# Patient Record
Sex: Male | Born: 1985 | Race: Black or African American | Hispanic: No | Marital: Single | Smoking: Current every day smoker
Health system: Southern US, Community
[De-identification: ages and names within clinical notes are randomized; demographics above are authoritative.]

## PROBLEM LIST (undated history)

## (undated) DIAGNOSIS — J45909 Unspecified asthma, uncomplicated: Secondary | ICD-10-CM

## (undated) DIAGNOSIS — I10 Essential (primary) hypertension: Secondary | ICD-10-CM

## (undated) DIAGNOSIS — F419 Anxiety disorder, unspecified: Secondary | ICD-10-CM

## (undated) DIAGNOSIS — T7840XA Allergy, unspecified, initial encounter: Secondary | ICD-10-CM

## (undated) HISTORY — DX: Unspecified asthma, uncomplicated: J45.909

## (undated) HISTORY — DX: Essential (primary) hypertension: I10

## (undated) HISTORY — DX: Allergy, unspecified, initial encounter: T78.40XA

---
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
## 2009-06-22 ENCOUNTER — Emergency Department (HOSPITAL_COMMUNITY): Admission: EM | Admit: 2009-06-22 | Discharge: 2009-06-22 | Payer: Self-pay | Admitting: Emergency Medicine

## 2010-05-07 ENCOUNTER — Emergency Department (HOSPITAL_COMMUNITY)
Admission: EM | Admit: 2010-05-07 | Discharge: 2010-05-07 | Disposition: A | Discharge status: Home / Self Care | Payer: Managed Care, Other (non HMO) | Attending: Emergency Medicine | Admitting: Emergency Medicine

## 2010-05-07 DIAGNOSIS — S01501A Unspecified open wound of lip, initial encounter: Secondary | ICD-10-CM | POA: Insufficient documentation

## 2010-05-07 DIAGNOSIS — Y9229 Other specified public building as the place of occurrence of the external cause: Secondary | ICD-10-CM | POA: Insufficient documentation

## 2010-05-09 ENCOUNTER — Emergency Department (HOSPITAL_COMMUNITY)
Admission: EM | Admit: 2010-05-09 | Discharge: 2010-05-09 | Disposition: A | Discharge status: Home / Self Care | Payer: Managed Care, Other (non HMO) | Attending: Emergency Medicine | Admitting: Emergency Medicine

## 2010-05-09 DIAGNOSIS — L089 Local infection of the skin and subcutaneous tissue, unspecified: Secondary | ICD-10-CM | POA: Insufficient documentation

## 2010-05-09 DIAGNOSIS — Z4802 Encounter for removal of sutures: Secondary | ICD-10-CM | POA: Insufficient documentation

## 2010-05-09 DIAGNOSIS — X58XXXA Exposure to other specified factors, initial encounter: Secondary | ICD-10-CM | POA: Insufficient documentation

## 2010-05-12 ENCOUNTER — Emergency Department (HOSPITAL_COMMUNITY): Payer: Managed Care, Other (non HMO)

## 2010-05-12 ENCOUNTER — Emergency Department (HOSPITAL_COMMUNITY)
Admission: EM | Admit: 2010-05-12 | Discharge: 2010-05-12 | Disposition: A | Discharge status: Home / Self Care | Payer: Managed Care, Other (non HMO) | Attending: Emergency Medicine | Admitting: Emergency Medicine

## 2010-05-12 DIAGNOSIS — Z139 Encounter for screening, unspecified: Secondary | ICD-10-CM | POA: Insufficient documentation

## 2010-05-12 DIAGNOSIS — R6884 Jaw pain: Secondary | ICD-10-CM | POA: Insufficient documentation

## 2010-05-12 DIAGNOSIS — R51 Headache: Secondary | ICD-10-CM | POA: Insufficient documentation

## 2011-03-10 ENCOUNTER — Ambulatory Visit (INDEPENDENT_AMBULATORY_CARE_PROVIDER_SITE_OTHER): Payer: Managed Care, Other (non HMO) | Admitting: Family Medicine

## 2011-03-10 VITALS — BP 117/74 | HR 84 | Temp 98.0°F | Resp 18 | Ht 68.0 in | Wt 224.0 lb

## 2011-03-10 DIAGNOSIS — F17200 Nicotine dependence, unspecified, uncomplicated: Secondary | ICD-10-CM

## 2011-03-10 DIAGNOSIS — J069 Acute upper respiratory infection, unspecified: Secondary | ICD-10-CM

## 2011-03-10 DIAGNOSIS — J029 Acute pharyngitis, unspecified: Secondary | ICD-10-CM

## 2011-03-10 DIAGNOSIS — Z716 Tobacco abuse counseling: Secondary | ICD-10-CM

## 2011-03-10 MED ORDER — FLUTICASONE PROPIONATE 50 MCG/ACT NA SUSP: 2.0000 | Freq: Every day | NASAL | Status: DC

## 2011-03-10 NOTE — Progress Notes (Signed)
  Subjective:    Patient ID: Henry Douglas, male    DOB: 03/29/1985, 26 y.o.   MRN: [id number]  HPI Patient started getting sick yesterday with a sore throat. He thinks he did have a fever. Then he developed a lot of head congestion he has only the sneezed twice. He has continued with the head congestion and sore throat today there is not febrile today. He went to work and left at noon. He denies much in the way of cough. He does smoke about a pack of cigarettes a day.  Otherwise is generally healthy and has not had repeated such infections. He lives with his mother, she has not been ill. He does not know if an obvious exposure to anything.   Review of Systems No ear pain. No chest pain   no headache but he does have knee and elbow pains. Objective:   Physical Exam  No acute distress at this time. Throat mildly erythematous but the uvula is a little ganglion swollen. His TMs are normal. Neck supple without nodes. Chest has some soft scattered wheezes at the bases. Heart was regular without murmurs.    Assessment & Plan:  Pharyngitis, URI This could either be a virus or strep. Suspect the former.  Strep screen is pending. Results for orders placed in visit on 03/10/11  POCT RAPID STREP A (OFFICE)      Component Value Range   Rapid Strep A Screen Negative  Negative      Long discussion about stopping smoking.  Seasonal instructions.

## 2011-03-10 NOTE — Patient Instructions (Addendum)
Claritin-D 1 Daily       Upper Respiratory Infection, Adult An upper respiratory infection (URI) is also known as the common cold. It is often caused by a type of germ (virus). Colds are easily spread (contagious). You can pass it to others by kissing, coughing, sneezing, or drinking out of the same glass. Usually, you get better in 1 or 2 weeks.  HOME CARE   Only take medicine as told by your doctor.   Use a warm mist humidifier or breathe in steam from a hot shower.   Drink enough water and fluids to keep your pee (urine) clear or pale yellow.   Get plenty of rest.   Return to work when your temperature is back to normal or as told by your doctor. You may use a face mask and wash your hands to stop your cold from spreading.  GET HELP RIGHT AWAY IF:   After the first few days, you feel you are getting worse.   You have questions about your medicine.   You have chills, shortness of breath, or brown or red spit (mucus).   You have yellow or brown snot (nasal discharge) or pain in the face, especially when you bend forward.   You have a fever, puffy (swollen) neck, pain when you swallow, or white spots in the back of your throat.   You have a bad headache, ear pain, sinus pain, or chest pain.   You have a high-pitched whistling sound when you breathe in and out (wheezing).   You have a lasting cough or cough up blood.   You have sore muscles or a stiff neck.  MAKE SURE YOU:   Understand these instructions.   Will watch your condition.   Will get help right away if you are not doing well or get worse.  Document Released: 06/28/2007 Document Revised: 09/21/2010 Document Reviewed: 05/16/2010 [facility] Patient Information 2012 [place], [place].

## 2012-01-09 ENCOUNTER — Ambulatory Visit (INDEPENDENT_AMBULATORY_CARE_PROVIDER_SITE_OTHER): Payer: Managed Care, Other (non HMO) | Admitting: Physician Assistant

## 2012-01-09 VITALS — BP 110/77 | HR 99 | Temp 99.9°F | Resp 18 | Ht 67.0 in | Wt 221.0 lb

## 2012-01-09 DIAGNOSIS — Z20818 Contact with and (suspected) exposure to other bacterial communicable diseases: Secondary | ICD-10-CM

## 2012-01-09 DIAGNOSIS — B9789 Other viral agents as the cause of diseases classified elsewhere: Secondary | ICD-10-CM

## 2012-01-09 DIAGNOSIS — R509 Fever, unspecified: Secondary | ICD-10-CM

## 2012-01-09 DIAGNOSIS — R51 Headache: Secondary | ICD-10-CM

## 2012-01-09 DIAGNOSIS — R52 Pain, unspecified: Secondary | ICD-10-CM

## 2012-01-09 DIAGNOSIS — J029 Acute pharyngitis, unspecified: Secondary | ICD-10-CM

## 2012-01-09 LAB — POCT CBC
Granulocyte percent: 75.3 %G (ref 37–80)
MCH, POC: 27.3 pg (ref 27–31.2)
MID (cbc): 1.1 — AB (ref 0–0.9)
POC Granulocyte: 10.5 — AB (ref 2–6.9)
POC LYMPH PERCENT: 16.7 %L (ref 10–50)
RBC: 5.42 M/uL (ref 4.69–6.13)
RDW, POC: 14.1 %
WBC: 13.9 10*3/uL — AB (ref 4.6–10.2)

## 2012-01-09 LAB — POCT INFLUENZA A/B: Influenza B, POC: NEGATIVE

## 2012-01-09 MED ORDER — PENICILLIN V POTASSIUM 500 MG PO TABS: 500.0000 mg | ORAL_TABLET | Freq: Three times a day (TID) | ORAL | Status: DC

## 2012-01-09 MED ORDER — ACETAMINOPHEN 325 MG PO TABS
1000.0000 mg | ORAL_TABLET | Freq: Once | ORAL | Status: AC
  Administered 2012-01-09: 975 mg via ORAL

## 2012-01-09 NOTE — Progress Notes (Signed)
  Subjective:    Patient ID: Henry Douglas, male    DOB: December 05, 1985, 26 y.o.   MRN: [id number]  HPI 26 yr old AAM presents with a 1 day history of sore throat, body aches, chills.  His boss had strep throat 2 weeks ago. He had a flu shot in October of this year. He took tylenol last night and no other OTCs.  He feels weak.  He denies any health problems. No SOB, very little cough.  He has had nasal congestion and some runny nose.  Review of Systems  All other systems reviewed and are negative.       Objective:   Physical Exam  Nursing note and vitals reviewed. Constitutional: He is oriented to person, place, and time. He appears well-developed and well-nourished.       Ill, but non-acute appearing  HENT:  Head: Normocephalic and atraumatic.  Right Ear: External ear normal.  Left Ear: External ear normal.  Mouth/Throat: No oropharyngeal exudate (he has B erythema of tonsils).       Clear rhinorrhea in nose.  Neck: Normal range of motion. Neck supple.  Cardiovascular: Normal rate, regular rhythm and normal heart sounds.   Pulmonary/Chest: Effort normal and breath sounds normal.  Lymphadenopathy:    He has no cervical adenopathy.  Neurological: He is alert and oriented to person, place, and time.  Skin: Skin is warm and dry.  Psychiatric: He has a normal mood and affect. His behavior is normal.    Results for orders placed in visit on 01/09/12  POCT INFLUENZA A/B      Component Value Range   Influenza A, POC Negative     Influenza B, POC Negative    POCT RAPID STREP A (OFFICE)      Component Value Range   Rapid Strep A Screen Negative  Negative  POCT CBC      Component Value Range   WBC 13.9 (*) 4.6 - 10.2 K/uL   Lymph, poc 2.3  0.6 - 3.4   POC LYMPH PERCENT 16.7  10 - 50 %L   MID (cbc) 1.1 (*) 0 - 0.9   POC MID % 8.0  0 - 12 %M   POC Granulocyte 10.5 (*) 2 - 6.9   Granulocyte percent 75.3  37 - 80 %G   RBC 5.42  4.69 - 6.13 M/uL   Hemoglobin 14.8  14.1 - 18.1 g/dL    HCT, POC 40.9  81.1 - 53.7 %   MCV 88.9  80 - 97 fL   MCH, POC 27.3  27 - 31.2 pg   MCHC 30.7 (*) 31.8 - 35.4 g/dL   RDW, POC 91.4     Platelet Count, POC 341  142 - 424 K/uL   MPV 6.4  0 - 99.8 fL   \      Assessment & Plan:  Pharyngitis, body aches, fever-I will cover for strep because he has had a definite exposure and leukocytosis.  Sending culture.

## 2012-01-09 NOTE — Patient Instructions (Addendum)
Push fluids and rest. OOWX 2 days.

## 2012-11-11 ENCOUNTER — Ambulatory Visit: Payer: Managed Care, Other (non HMO)

## 2012-11-11 ENCOUNTER — Ambulatory Visit (INDEPENDENT_AMBULATORY_CARE_PROVIDER_SITE_OTHER): Payer: Managed Care, Other (non HMO) | Admitting: Family Medicine

## 2012-11-11 VITALS — BP 125/71 | HR 91 | Temp 98.3°F | Resp 18 | Wt 220.0 lb

## 2012-11-11 DIAGNOSIS — R079 Chest pain, unspecified: Secondary | ICD-10-CM

## 2012-11-11 MED ORDER — PREDNISONE 20 MG PO TABS: ORAL_TABLET | ORAL | Status: DC

## 2012-11-11 MED ORDER — CYCLOBENZAPRINE HCL 5 MG PO TABS: 5.0000 mg | ORAL_TABLET | Freq: Every day | ORAL | Status: DC

## 2012-11-11 NOTE — Patient Instructions (Signed)
Follow up this weekend.

## 2012-11-11 NOTE — Progress Notes (Signed)
  Subjective:  This chart was scribed for Henry Sidle, MD by [name], ED scribe.  This patient was seen in room [facility] Room 14 and the patient's care was started at 7:56 PM.   Patient ID: Henry Douglas, male    DOB: 28-May-1985, 27 y.o.   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  He presents with a chief complaint of 3 days of intermittent sharp central chest pain radiating to his back.  Pain initially began on waking.  It is intermittent and generally worsens throughout the day with physical activities.  It is worsened by lying down and sometimes by lifting and lowering his arms.  It is slightly worsened by deep breathing.  His pain woke him up this morning at 5 AM.  He also complains of intermittent SOB.  He also notes some difficulty sleeping due to his pain.  He denies nausea, difficulty swallowing, fever, night sweats, or abdominal pain.  He denies prior h/o similar symptoms.  He states he has h/o asthma as a child and he has been told he has hypertension "when I was younger" but he does not take any medications for this.      Past Medical History  Diagnosis Date  . Allergy   . Asthma   . Hypertension     Prior to Admission medications   Not on File    Allergies  Allergen Reactions  . Nystatin Swelling    Throat swelling      Review of Systems  Respiratory: Positive for shortness of breath.   Cardiovascular: Positive for chest pain.       Objective:   Physical Exam no acute distress HEENT: Unremarkable Chest: Clear to auscultation, patient very tender in the midclavicular line at about the third rib. Heart: Regular no murmur EKG: Normal sinus rhythm [facility] reading (PRIMARY) by  Dr. Milus Glazier: Normal chest x-ray.      Assessment & Plan:   Pectoralis strain with irritation of superficial pectoral nerve  Plan: Flexeril 5 mg at at bedtime, prednisone 40 mg daily x5, followup in  5 days

## 2012-11-16 ENCOUNTER — Ambulatory Visit (INDEPENDENT_AMBULATORY_CARE_PROVIDER_SITE_OTHER): Payer: Managed Care, Other (non HMO) | Admitting: Family Medicine

## 2012-11-16 VITALS — BP 120/80 | HR 91 | Temp 98.3°F | Resp 16 | Ht 67.5 in | Wt 220.0 lb

## 2012-11-16 DIAGNOSIS — M25519 Pain in unspecified shoulder: Secondary | ICD-10-CM

## 2012-11-16 DIAGNOSIS — M25512 Pain in left shoulder: Secondary | ICD-10-CM

## 2012-11-16 NOTE — Patient Instructions (Signed)

## 2012-11-20 NOTE — Progress Notes (Signed)
This chart was scribed for Norberto Sorenson, MD, by Yevette Edwards, ED Scribe. This patient's care was started at 4:07 PM.   Subjective:    Patient ID: Henry Douglas, male    DOB: 09-11-1985, 27 y.o.   MRN: [id number]  Chief Complaint  Patient presents with  . Follow-up    still having pain in (L) shoulder   HPI  HPI Comments: Henry Douglas is a 27 y.o. male who presents to the [facility] complaining of stiffness to his left shoulder which began two days ago. He denies any recent injuries to his shoulder. The pt states that he experiences intermittent stiffness to his left shoulder, especially when he raises his arm above his head. He denies any changes to his ROM, weakness, or paresthesia to his arm. The flexeril which he was prescribed five days ago has helped with the stiffness. He has not been using ice or heat to mitigate the stiffness.   He was treated at [facility] five days ago complaining of chest pain which had been occurring for three days and which was worsened with deep inspiration, movement, and recumbency. He was diagnosed with pectoralis strain by Dr. Milus Glazier and placed on prednisone 40 mg/ a day and flexeril for night-time use. The pt had a normal chest x-ray and norma EKG. The pt reports resolution to the chest pain.   He has been placed on leave form work due to the chest pain, and he cannot return to work until he has been cleared by a doctor. The pt is ready to return to work.   Past Medical History  Diagnosis Date  . Allergy   . Asthma   . Hypertension    Current Outpatient Prescriptions on File Prior to Visit  Medication Sig Dispense Refill  . cyclobenzaprine (FLEXERIL) 5 MG tablet Take 1 tablet (5 mg total) by mouth at bedtime.  30 tablet  1   No current facility-administered medications on file prior to visit.   Allergies  Allergen Reactions  . Nystatin Swelling    Throat swelling    Review of Systems  Constitutional: Negative for fever, chills, diaphoresis,  activity change, appetite change and fatigue.  Respiratory: Negative for chest tightness and shortness of breath.   Cardiovascular: Negative for chest pain.  Musculoskeletal: Positive for arthralgias and myalgias. Negative for gait problem, joint swelling, neck pain and neck stiffness.  Skin: Negative for color change, rash and wound.  Neurological: Negative for weakness and numbness.  Hematological: Negative for adenopathy. Does not bruise/bleed easily.    Vitals: BP 120/80  Pulse 91  Temp(Src) 98.3 F (36.8 C) (Oral)  Resp 16  Ht 5' 7.5" (1.715 m)  Wt 220 lb (99.791 kg)  BMI 33.93 kg/m2  SpO2 100%     Objective:   Physical Exam  Nursing note and vitals reviewed. Constitutional: He is oriented to person, place, and time. He appears well-developed and well-nourished. No distress.  HENT:  Head: Normocephalic and atraumatic.  Eyes: EOM are normal.  Neck: Neck supple. No tracheal deviation present.  Cardiovascular: Normal rate.   Pulmonary/Chest: Effort normal. No respiratory distress.  Musculoskeletal: Normal range of motion.  Good strength on empty can test.  Normal ROM.  Normal internal and external rotation strength.  Normal deltoid, biceps, and triceps strength.   Neurological: He is alert and oriented to person, place, and time.  Skin: Skin is warm and dry.  Psychiatric: He has a normal mood and affect. His behavior is normal.  Assessment & Plan:  4:13 PM- Discussed treatment plan with patient which includes a renewal of flexeril, and the patient agreed to the plan. Informed pt that it typically takes 4-6 weeks for resolution of muscle strain. Advised the pt to place heat to the shoulder in combination with using flexeril at night. Warned pt to continue to use his shoulder to prevent "frozen shoulder." Pain in joint, shoulder region, left   I personally performed the services described in this documentation, which was scribed in my presence. The recorded  information has been reviewed and considered, and addended by me as needed.  Norberto Sorenson, MD MPH

## 2013-02-03 ENCOUNTER — Ambulatory Visit (INDEPENDENT_AMBULATORY_CARE_PROVIDER_SITE_OTHER): Payer: Managed Care, Other (non HMO) | Admitting: Physician Assistant

## 2013-02-03 VITALS — BP 136/82 | HR 78 | Temp 98.2°F | Resp 16 | Ht 68.0 in | Wt 216.0 lb

## 2013-02-03 DIAGNOSIS — J988 Other specified respiratory disorders: Principal | ICD-10-CM

## 2013-02-03 DIAGNOSIS — R05 Cough: Secondary | ICD-10-CM

## 2013-02-03 DIAGNOSIS — R059 Cough, unspecified: Secondary | ICD-10-CM

## 2013-02-03 DIAGNOSIS — M791 Myalgia, unspecified site: Secondary | ICD-10-CM

## 2013-02-03 DIAGNOSIS — IMO0001 Reserved for inherently not codable concepts without codable children: Secondary | ICD-10-CM

## 2013-02-03 DIAGNOSIS — B9789 Other viral agents as the cause of diseases classified elsewhere: Secondary | ICD-10-CM

## 2013-02-03 DIAGNOSIS — J029 Acute pharyngitis, unspecified: Secondary | ICD-10-CM

## 2013-02-03 LAB — POCT RAPID STREP A (OFFICE): Rapid Strep A Screen: NEGATIVE

## 2013-02-03 LAB — POCT INFLUENZA A/B
INFLUENZA B, POC: NEGATIVE
Influenza A, POC: NEGATIVE

## 2013-02-03 MED ORDER — BENZONATATE 100 MG PO CAPS: 100.0000 mg | ORAL_CAPSULE | Freq: Three times a day (TID) | ORAL | Status: DC | PRN

## 2013-02-03 MED ORDER — IPRATROPIUM BROMIDE 0.03 % NA SOLN: 2.0000 | Freq: Two times a day (BID) | NASAL | Status: DC

## 2013-02-03 NOTE — Progress Notes (Signed)
   Subjective:    Patient ID: Henry Douglas, male    DOB: 1985-05-18, 28 y.o.   MRN: [id number]  HPI   Henry Douglas is a pleasant 28 yr old male here with concern for illness.   Symptoms include sore throat, muscle aches, chills, productive cough.  Endorses a little SOB but no wheezing.  Some associated HA and nasal congestion.  All symptoms started about 3 days ago.  Subjective fever on the first night but did not take temp.  No GI symptoms.  There was one sick contact at work, unsure of diagnosis.  No flu shot this season.  Currently smoking 1/2ppd.  Has used Nyquil for symptoms which provides some relief.     Review of Systems  Constitutional: Positive for fever (subjective) and chills.  HENT: Positive for congestion and sore throat. Negative for rhinorrhea.   Respiratory: Positive for cough. Negative for shortness of breath and wheezing.   Cardiovascular: Negative.   Gastrointestinal: Negative.   Musculoskeletal: Positive for arthralgias and myalgias.  Skin: Negative.   Neurological: Positive for headaches.       Objective:   Physical Exam  Vitals reviewed. Constitutional: He is oriented to person, place, and time. He appears well-developed and well-nourished. No distress.  HENT:  Head: Normocephalic and atraumatic.  Right Ear: Tympanic membrane and ear canal normal.  Left Ear: Tympanic membrane and ear canal normal.  Mouth/Throat: Uvula is midline and mucous membranes are normal. Posterior oropharyngeal erythema present. No oropharyngeal exudate or posterior oropharyngeal edema.  Eyes: Conjunctivae are normal. No scleral icterus.  Neck: Neck supple.  Cardiovascular: Normal rate, regular rhythm and normal heart sounds.   Pulmonary/Chest: Effort normal and breath sounds normal. He has no wheezes. He has no rales.  Lymphadenopathy:    He has no cervical adenopathy.  Neurological: He is alert and oriented to person, place, and time.  Skin: Skin is warm and dry.  Psychiatric:  He has a normal mood and affect. His behavior is normal.     Results for orders placed in visit on 02/03/13  POCT INFLUENZA A/B      Result Value Range   Influenza A, POC Negative     Influenza B, POC Negative    POCT RAPID STREP A (OFFICE)      Result Value Range   Rapid Strep A Screen Negative  Negative        Assessment & Plan:  Viral respiratory illness - Plan: ipratropium (ATROVENT) 0.03 % nasal spray, DISCONTINUED: ipratropium (ATROVENT) 0.03 % nasal spray  Sore throat - Plan: POCT Influenza A/B, POCT rapid strep A, Culture, Group A Strep  Myalgia - Plan: POCT Influenza A/B, POCT rapid strep A, Culture, Group A Strep  Cough - Plan: POCT Influenza A/B, benzonatate (TESSALON) 100 MG capsule, DISCONTINUED: benzonatate (TESSALON) 100 MG capsule   Henry Douglas is a pleasant 28 yr old male here with URI symptoms, myalgia.  No documented fevers.  Rapid flu and strep tests both negative.  Throat culture pending.  Suspect viral illness, possibly influenza.  At this point symptom onset >48 hrs ago, no benefit from Tamiflu. Will treat symptoms with Atrovent, Tessalon.  Push fluids rest.  Work note provided.  Discussed RTC precautions with patient  E. Frances FurbishElizabeth Irvin Lizama MHS, PA-C [facility] 1/12/201512:02 PM

## 2013-02-03 NOTE — Patient Instructions (Signed)
Begin using the ipratropium nasal spray 2-3 times per day to help relieve congestion  Use the benzonatate cough pills every 8 hours as needed for cough  Mucinex, Cepacol if needed  Drink plenty of fluids (water is best!) and get plenty of rest  Please let us know if any symptoms are worsening or not improving   Upper Respiratory Infection, Adult An upper respiratory infection (URI) is also sometimes known as the common cold. The upper respiratory tract includes the nose, sinuses, throat, trachea, and bronchi. Bronchi are the airways leading to the lungs. Most people improve within 1 week, but symptoms can last up to 2 weeks. A residual cough may last even longer.  CAUSES Many different viruses can infect the tissues lining the upper respiratory tract. The tissues become irritated and inflamed and often become very moist. Mucus production is also common. A cold is contagious. You can easily spread the virus to others by oral contact. This includes kissing, sharing a glass, coughing, or sneezing. Touching your mouth or nose and then touching a surface, which is then touched by another person, can also spread the virus. SYMPTOMS  Symptoms typically develop 1 to 3 days after you come in contact with a cold virus. Symptoms vary from person to person. They may include:  Runny nose.  Sneezing.  Nasal congestion.  Sinus irritation.  Sore throat.  Loss of voice (laryngitis).  Cough.  Fatigue.  Muscle aches.  Loss of appetite.  Headache.  Low-grade fever. DIAGNOSIS  You might diagnose your own cold based on familiar symptoms, since most people get a cold 2 to 3 times a year. Your caregiver can confirm this based on your exam. Most importantly, your caregiver can check that your symptoms are not due to another disease such as strep throat, sinusitis, pneumonia, asthma, or epiglottitis. Blood tests, throat tests, and X-rays are not necessary to diagnose a common cold, but they may  sometimes be helpful in excluding other more serious diseases. Your caregiver will decide if any further tests are required. RISKS AND COMPLICATIONS  You may be at risk for a more severe case of the common cold if you smoke cigarettes, have chronic heart disease (such as heart failure) or lung disease (such as asthma), or if you have a weakened immune system. The very young and very old are also at risk for more serious infections. Bacterial sinusitis, middle ear infections, and bacterial pneumonia can complicate the common cold. The common cold can worsen asthma and chronic obstructive pulmonary disease (COPD). Sometimes, these complications can require emergency medical care and may be life-threatening. PREVENTION  The best way to protect against getting a cold is to practice good hygiene. Avoid oral or hand contact with people with cold symptoms. Wash your hands often if contact occurs. There is no clear evidence that vitamin C, vitamin E, echinacea, or exercise reduces the chance of developing a cold. However, it is always recommended to get plenty of rest and practice good nutrition. TREATMENT  Treatment is directed at relieving symptoms. There is no cure. Antibiotics are not effective, because the infection is caused by a virus, not by bacteria. Treatment may include:  Increased fluid intake. Sports drinks offer valuable electrolytes, sugars, and fluids.  Breathing heated mist or steam (vaporizer or shower).  Eating chicken soup or other clear broths, and maintaining good nutrition.  Getting plenty of rest.  Using gargles or lozenges for comfort.  Controlling fevers with ibuprofen or acetaminophen as directed by your caregiver.  Increasing usage of your inhaler if you have asthma. Zinc gel and zinc lozenges, taken in the first 24 hours of the common cold, can shorten the duration and lessen the severity of symptoms. Pain medicines may help with fever, muscle aches, and throat pain. A  variety of non-prescription medicines are available to treat congestion and runny nose. Your caregiver can make recommendations and may suggest nasal or lung inhalers for other symptoms.  HOME CARE INSTRUCTIONS   Only take over-the-counter or prescription medicines for pain, discomfort, or fever as directed by your caregiver.  Use a warm mist humidifier or inhale steam from a shower to increase air moisture. This may keep secretions moist and make it easier to breathe.  Drink enough water and fluids to keep your urine clear or pale yellow.  Rest as needed.  Return to work when your temperature has returned to normal or as your caregiver advises. You may need to stay home longer to avoid infecting others. You can also use a face mask and careful hand washing to prevent spread of the virus. SEEK MEDICAL CARE IF:   After the first few days, you feel you are getting worse rather than better.  You need your caregiver's advice about medicines to control symptoms.  You develop chills, worsening shortness of breath, or brown or red sputum. These may be signs of pneumonia.  You develop yellow or brown nasal discharge or pain in the face, especially when you bend forward. These may be signs of sinusitis.  You develop a fever, swollen neck glands, pain with swallowing, or white areas in the back of your throat. These may be signs of strep throat. SEEK IMMEDIATE MEDICAL CARE IF:   You have a fever.  You develop severe or persistent headache, ear pain, sinus pain, or chest pain.  You develop wheezing, a prolonged cough, cough up blood, or have a change in your usual mucus (if you have chronic lung disease).  You develop sore muscles or a stiff neck. Document Released: 07/05/2000 Document Revised: 04/03/2011 Document Reviewed: 05/13/2010 [facility] Patient Information 2014 [place].

## 2013-02-07 LAB — CULTURE, GROUP A STREP

## 2013-02-25 ENCOUNTER — Ambulatory Visit (INDEPENDENT_AMBULATORY_CARE_PROVIDER_SITE_OTHER): Payer: Managed Care, Other (non HMO) | Admitting: Family Medicine

## 2013-02-25 VITALS — BP 128/60 | HR 118 | Temp 102.9°F | Resp 16 | Ht 67.0 in | Wt 216.0 lb

## 2013-02-25 DIAGNOSIS — R509 Fever, unspecified: Secondary | ICD-10-CM

## 2013-02-25 DIAGNOSIS — B9789 Other viral agents as the cause of diseases classified elsewhere: Secondary | ICD-10-CM

## 2013-02-25 DIAGNOSIS — J029 Acute pharyngitis, unspecified: Secondary | ICD-10-CM

## 2013-02-25 DIAGNOSIS — B349 Viral infection, unspecified: Secondary | ICD-10-CM

## 2013-02-25 LAB — POCT RAPID STREP A (OFFICE): Rapid Strep A Screen: NEGATIVE

## 2013-02-25 LAB — POCT INFLUENZA A/B
INFLUENZA B, POC: NEGATIVE
Influenza A, POC: NEGATIVE

## 2013-02-25 MED ORDER — OSELTAMIVIR PHOSPHATE 75 MG PO CAPS: 75.0000 mg | ORAL_CAPSULE | Freq: Two times a day (BID) | ORAL | Status: DC

## 2013-02-25 NOTE — Progress Notes (Signed)
Subjective: 28 year old man who was okay until this morning. He developed a runny nose sore throat. He developed fevers the day went on. He took some Advil sinus and Mucinex. He is roommate brought him here to the office. He has had strep in the past. He did not get a flu shot this year. Having a runny nose not much cough. He had very much the same symptoms 3 weeks ago and test negative.  Objective:  Sick appearing, shaking with cold. Skin very warm to touch. TMs normal. Throat erythematous without exudate the uvula looks well swollen. Neck was supple tender anteriorly with some small nodes. Chest is clear to auscultation. Heart regular without murmurs.  Assessment: Acute respiratory illness, influenza versus strep versus other Flu-like symptoma  Plan: Flu swab Strep test  Results for orders placed in visit on 02/25/13  POCT RAPID STREP A (OFFICE)      Result Value Range   Rapid Strep A Screen Negative  Negative  POCT INFLUENZA A/B      Result Value Range   Influenza A, POC Negative     Influenza B, POC Negative     Will treat for him even though the flu swab is negative.

## 2013-02-25 NOTE — Patient Instructions (Addendum)
Drink plenty of fluids  Take Tylenol (500 mg x2) every 6 hours as needed for fever and aching. Can alternate this with ibuprofen (3x200 mg) every 6 hours as needed  Get plenty of rest  Return if worse

## 2013-02-28 LAB — CULTURE, GROUP A STREP: Organism ID, Bacteria: NORMAL

## 2013-03-04 ENCOUNTER — Ambulatory Visit (INDEPENDENT_AMBULATORY_CARE_PROVIDER_SITE_OTHER): Payer: Managed Care, Other (non HMO) | Admitting: Family Medicine

## 2013-03-04 VITALS — BP 120/66 | HR 119 | Temp 100.6°F | Resp 18 | Ht 68.0 in | Wt 213.8 lb

## 2013-03-04 DIAGNOSIS — K5289 Other specified noninfective gastroenteritis and colitis: Secondary | ICD-10-CM

## 2013-03-04 DIAGNOSIS — R197 Diarrhea, unspecified: Secondary | ICD-10-CM

## 2013-03-04 DIAGNOSIS — K529 Noninfective gastroenteritis and colitis, unspecified: Secondary | ICD-10-CM

## 2013-03-04 DIAGNOSIS — R112 Nausea with vomiting, unspecified: Secondary | ICD-10-CM

## 2013-03-04 DIAGNOSIS — Z113 Encounter for screening for infections with a predominantly sexual mode of transmission: Secondary | ICD-10-CM

## 2013-03-04 DIAGNOSIS — R509 Fever, unspecified: Secondary | ICD-10-CM

## 2013-03-04 LAB — POCT CBC
Granulocyte percent: 83.4 %G — AB (ref 37–80)
HEMATOCRIT: 46.4 % (ref 43.5–53.7)
Hemoglobin: 14.6 g/dL (ref 14.1–18.1)
Lymph, poc: 0.6 (ref 0.6–3.4)
MCH: 28 pg (ref 27–31.2)
MCHC: 31.5 g/dL — AB (ref 31.8–35.4)
MCV: 89 fL (ref 80–97)
MID (CBC): 0.4 (ref 0–0.9)
MPV: 6.5 fL (ref 0–99.8)
POC Granulocyte: 5.3 (ref 2–6.9)
POC LYMPH PERCENT: 9.8 %L — AB (ref 10–50)
POC MID %: 6.8 %M (ref 0–12)
Platelet Count, POC: 312 10*3/uL (ref 142–424)
RBC: 5.21 M/uL (ref 4.69–6.13)
RDW, POC: 14 %
WBC: 6.3 10*3/uL (ref 4.6–10.2)

## 2013-03-04 LAB — POCT URINALYSIS DIPSTICK
Bilirubin, UA: NEGATIVE
Glucose, UA: NEGATIVE
KETONES UA: NEGATIVE
Leukocytes, UA: NEGATIVE
Nitrite, UA: NEGATIVE
PH UA: 5.5
RBC UA: NEGATIVE
SPEC GRAV UA: 1.025
UROBILINOGEN UA: 0.2

## 2013-03-04 MED ORDER — ONDANSETRON 4 MG PO TBDP
4.0000 mg | ORAL_TABLET | Freq: Once | ORAL | Status: AC
  Administered 2013-03-04: 4 mg via ORAL

## 2013-03-04 NOTE — Patient Instructions (Signed)
Drink plenty of fluids  Take Tylenol if needed for pain  Take the Zofran every 4-6 hours as needed for nausea or vomiting  Get over the counter Imodium to use if needed for diarrhea. Full instructions on the box.  In the event of acute abdominal pain, prolonged vomiting, inability to keep down liquids, passing blood in stool or vomit, or other unpredicted problems go to the emergency room if necessary. Otherwise return here tomorrow if needed.

## 2013-03-04 NOTE — Progress Notes (Signed)
Subjective: 28 year old man who has had nausea vomiting and diarrhea since he got up at about 6 AM today. He was unable to work. He has had a fever and feels miserable. He has not been around anyone else with similar illness that he knows of. 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  He also requests STD testing.  Objective: Ill-appearing man. Chest clear. Heart tachycardic. Abdomen has occasional bowel sounds but is somewhat diminished. Generally soft without organomegaly masses or tenderness. No CVA tenderness.  Assessment: Nausea and vomiting Diarrhea Fever Acute femoral gastroenteritis  Plan: CBC and urine Zofran 4 mg by mouth Tylenol 1000 mg by mouth  Results for orders placed in visit on 03/04/13  POCT CBC      Result Value Range   WBC 6.3  4.6 - 10.2 K/uL   Lymph, poc 0.6  0.6 - 3.4   POC LYMPH PERCENT 9.8 (*) 10 - 50 %L   MID (cbc) 0.4  0 - 0.9   POC MID % 6.8  0 - 12 %M   POC Granulocyte 5.3  2 - 6.9   Granulocyte percent 83.4 (*) 37 - 80 %G   RBC 5.21  4.69 - 6.13 M/uL   Hemoglobin 14.6  14.1 - 18.1 g/dL   HCT, POC 16.146.4  09.643.5 - 53.7 %   MCV 89.0  80 - 97 fL   MCH, POC 28.0  27 - 31.2 pg   MCHC 31.5 (*) 31.8 - 35.4 g/dL   RDW, POC 04.514.0     Platelet Count, POC 312  142 - 424 K/uL   MPV 6.5  0 - 99.8 fL  POCT URINALYSIS DIPSTICK      Result Value Range   Color, UA yellow     Clarity, UA clear     Glucose, UA neg     Bilirubin, UA neg     Ketones, UA neg     Spec Grav, UA 1.025     Blood, UA neg     pH, UA 5.5     Protein, UA trace     Urobilinogen, UA 0.2     Nitrite, UA neg     Leukocytes, UA Negative     Will treat symptomatically for viral gastroenteritis

## 2013-03-05 ENCOUNTER — Telehealth: Payer: Self-pay

## 2013-03-05 LAB — HIV ANTIBODY (ROUTINE TESTING W REFLEX): HIV: NONREACTIVE

## 2013-03-05 LAB — RPR

## 2013-03-05 MED ORDER — ONDANSETRON 4 MG PO TBDP: ORAL_TABLET | ORAL | Status: DC

## 2013-03-05 NOTE — Telephone Encounter (Signed)
Pt called and stated that some zofran was supposed to have been sent to pharm and it is not at pharm. Checked w/Dr Alwyn RenHopper who gave OK to order for pt 4 mg ODT 1 tab Q 4-6 hrs prn nausea. #10. Sent in.

## 2013-03-06 LAB — GC/CHLAMYDIA PROBE AMP
CT Probe RNA: NEGATIVE
GC PROBE AMP APTIMA: NEGATIVE

## 2013-09-12 ENCOUNTER — Ambulatory Visit (INDEPENDENT_AMBULATORY_CARE_PROVIDER_SITE_OTHER): Payer: Managed Care, Other (non HMO) | Admitting: Internal Medicine

## 2013-09-12 VITALS — BP 126/80 | HR 111 | Temp 102.3°F | Resp 18 | Ht 69.0 in | Wt 208.0 lb

## 2013-09-12 DIAGNOSIS — J039 Acute tonsillitis, unspecified: Secondary | ICD-10-CM

## 2013-09-12 LAB — POCT CBC
GRANULOCYTE PERCENT: 86.1 % — AB (ref 37–80)
HCT, POC: 42.6 % — AB (ref 43.5–53.7)
Hemoglobin: 13.9 g/dL — AB (ref 14.1–18.1)
LYMPH, POC: 1.9 (ref 0.6–3.4)
MCH: 28.2 pg (ref 27–31.2)
MCHC: 32.7 g/dL (ref 31.8–35.4)
MCV: 86.2 fL (ref 80–97)
MID (CBC): 0.6 (ref 0–0.9)
MPV: 5.7 fL (ref 0–99.8)
POC Granulocyte: 15.5 — AB (ref 2–6.9)
POC LYMPH PERCENT: 10.4 %L (ref 10–50)
POC MID %: 3.5 % (ref 0–12)
Platelet Count, POC: 265 10*3/uL (ref 142–424)
RBC: 4.94 M/uL (ref 4.69–6.13)
RDW, POC: 13.6 %
WBC: 18 10*3/uL — AB (ref 4.6–10.2)

## 2013-09-12 LAB — POCT RAPID STREP A (OFFICE): RAPID STREP A SCREEN: NEGATIVE

## 2013-09-12 MED ORDER — AMOXICILLIN 875 MG PO TABS: 875.0000 mg | ORAL_TABLET | Freq: Two times a day (BID) | ORAL | Status: DC

## 2013-09-12 NOTE — Progress Notes (Signed)
   Subjective:  This chart was scribed for Tonye Pearsonobert P Adolph Clutter, MD by Bronson CurbJacqueline Melvin, ED Scribe. This patient was seen in room Room/bed 8 and the patient's care was started at 5:44 PM.   Patient ID: Henry Douglas, male    DOB: 14-Jul-1985, 28 y.o.   MRN: [id number]  HPI   HPI Comments: Henry Douglas is a 28 y.o. male who presents to the [facility] complaining of sore throat onset yesterday. There is associated fever (T max 102.3 F) and generalized body aches. Patient also reports pain when swallowing and states this prevents him from eating. He denies sick contacts.   Review of Systems  Constitutional: Positive for fever.  HENT: Positive for sore throat. Negative for mouth sores and sneezing.   Eyes: Negative for visual disturbance.  Respiratory: Negative for cough.   Gastrointestinal: Negative for vomiting and diarrhea.  Genitourinary: Negative for dysuria.  Musculoskeletal: Positive for myalgias.       Objective:   Physical Exam  Nursing note and vitals reviewed. Constitutional: He appears well-developed and well-nourished. He appears distressed.  HENT:  Right Ear: External ear normal.  Left Ear: External ear normal.  Nose: Nose normal.  Tonsils are 3+ and inflamed with exudate.  Eyes: Conjunctivae are normal. Pupils are equal, round, and reactive to light.  Cardiovascular: Regular rhythm.   No murmur heard. tachycardia  Pulmonary/Chest: Effort normal and breath sounds normal.  Lymphadenopathy:    He has cervical adenopathy.   Results for orders placed in visit on 09/12/13  POCT RAPID STREP A (OFFICE)      Result Value Ref Range   Rapid Strep A Screen Negative  Negative  POCT CBC      Result Value Ref Range   WBC 18.0 (*) 4.6 - 10.2 K/uL   Lymph, poc 1.9  0.6 - 3.4   POC LYMPH PERCENT 10.4  10 - 50 %L   MID (cbc) 0.6  0 - 0.9   POC MID % 3.5  0 - 12 %M   POC Granulocyte 15.5 (*) 2 - 6.9   Granulocyte percent 86.1 (*) 37 - 80 %G   RBC 4.94  4.69 - 6.13 M/uL   Hemoglobin 13.9 (*) 14.1 - 18.1 g/dL   HCT, POC 40.942.6 (*) 81.143.5 - 53.7 %   MCV 86.2  80 - 97 fL   MCH, POC 28.2  27 - 31.2 pg   MCHC 32.7  31.8 - 35.4 g/dL   RDW, POC 91.413.6     Platelet Count, POC 265  142 - 424 K/uL   MPV 5.7  0 - 99.8 fL   BP 126/80  Pulse 111  Temp(Src) 102.3 F (39.1 C) (Oral)  Resp 18  Ht 5\' 9"  (1.753 m)  Wt 208 lb (94.348 kg)  BMI 30.70 kg/m2  SpO2 98%     Assessment & Plan:  Acute tonsillitis - Plan: POCT rapid strep A, Culture, Group A Strep, POCT CBC  Meds ordered this encounter  Medications  . amoxicillin (AMOXIL) 875 MG tablet    Sig: Take 1 tablet (875 mg total) by mouth 2 (two) times daily.    Dispense:  20 tablet    Refill:  0   Ibuprofen thr cult sent  I have completed the patient encounter in its entirety as documented by the scribe, with editing by me where necessary. Delsie Amador P. Merla Richesoolittle, M.D.

## 2013-09-15 LAB — CULTURE, GROUP A STREP

## 2013-12-25 ENCOUNTER — Ambulatory Visit (INDEPENDENT_AMBULATORY_CARE_PROVIDER_SITE_OTHER): Payer: Managed Care, Other (non HMO) | Admitting: Emergency Medicine

## 2013-12-25 VITALS — BP 132/88 | HR 72 | Temp 98.1°F | Resp 16 | Ht 69.0 in | Wt 215.8 lb

## 2013-12-25 DIAGNOSIS — Z0189 Encounter for other specified special examinations: Secondary | ICD-10-CM

## 2013-12-25 DIAGNOSIS — Z202 Contact with and (suspected) exposure to infections with a predominantly sexual mode of transmission: Secondary | ICD-10-CM

## 2013-12-25 DIAGNOSIS — Z0000 Encounter for general adult medical examination without abnormal findings: Secondary | ICD-10-CM

## 2013-12-25 LAB — POCT CBC
GRANULOCYTE PERCENT: 56.1 % (ref 37–80)
HCT, POC: 44.8 % (ref 43.5–53.7)
Hemoglobin: 14.2 g/dL (ref 14.1–18.1)
LYMPH, POC: 2.4 (ref 0.6–3.4)
MCH, POC: 27.5 pg (ref 27–31.2)
MCHC: 31.8 g/dL (ref 31.8–35.4)
MCV: 86.5 fL (ref 80–97)
MID (CBC): 0.4 (ref 0–0.9)
MPV: 5.8 fL (ref 0–99.8)
POC Granulocyte: 3.5 (ref 2–6.9)
POC LYMPH %: 37.7 % (ref 10–50)
POC MID %: 6.2 % (ref 0–12)
Platelet Count, POC: 318 10*3/uL (ref 142–424)
RBC: 5.18 M/uL (ref 4.69–6.13)
RDW, POC: 14.1 %
WBC: 6.3 10*3/uL (ref 4.6–10.2)

## 2013-12-25 LAB — POCT URINALYSIS DIPSTICK
BILIRUBIN UA: NEGATIVE
Blood, UA: NEGATIVE
GLUCOSE UA: NEGATIVE
Ketones, UA: NEGATIVE
LEUKOCYTES UA: NEGATIVE
NITRITE UA: NEGATIVE
PH UA: 6
PROTEIN UA: NEGATIVE
SPEC GRAV UA: 1.02
UROBILINOGEN UA: 0.2

## 2013-12-25 NOTE — Progress Notes (Signed)
[facility] [address], [place] [id number] [phone]  Date:  12/25/2013   Name:  Henry Douglas   DOB:  [birth year]   MRN:  [id number]  PCP:  No PCP Per Patient    Chief Complaint: Annual Exam   History of Present Illness:  Henry Douglas is a 28 y.o. very pleasant male patient who presents with the following:  Requests a wellness exam Smokes 1 PPD. Works full-time Denies other complaint or health concern today.   There are no active problems to display for this patient.   Past Medical History  Diagnosis Date  . Allergy   . Asthma   . Hypertension     History reviewed. No pertinent past surgical history.  History  Substance Use Topics  . Smoking status: Current Every Day Smoker -- 1.00 packs/day for 3 years    Types: Cigarettes  . Smokeless tobacco: Not on file  . Alcohol Use: 0.0 oz/week    0 Not specified per week    Family History  Problem Relation Age of Onset  . Hypertension Mother   . Diabetes Sister     Allergies  Allergen Reactions  . Nystatin Swelling    Throat swelling    Medication list has been reviewed and updated.  Current Outpatient Prescriptions on File Prior to Visit  Medication Sig Dispense Refill  . cyclobenzaprine (FLEXERIL) 5 MG tablet Take 1 tablet (5 mg total) by mouth at bedtime. (Patient not taking: Reported on 12/25/2013) 30 tablet 1  . ipratropium (ATROVENT) 0.03 % nasal spray Place 2 sprays into the nose 2 (two) times daily. (Patient not taking: Reported on 12/25/2013) 30 mL 1  . ondansetron (ZOFRAN-ODT) 4 MG disintegrating tablet Take 1 tab every 4-6 hours as needed for nausea or vomiting. (Patient not taking: Reported on 12/25/2013) 10 tablet 0  . oseltamivir (TAMIFLU) 75 MG capsule Take 1 capsule (75 mg total) by mouth 2 (two) times daily. (Patient not taking: Reported on 12/25/2013) 10 capsule 0  . Pseudoeph-CPM-DM-APAP (TYLENOL CHILDRENS COLD/COUGH PO) Take by mouth.    .  Pseudoeph-Doxylamine-DM-APAP (NYQUIL PO) Take by mouth.     No current facility-administered medications on file prior to visit.    Review of Systems:  As per HPI, otherwise negative.    Physical Examination: Filed Vitals:   12/25/13 1447  BP: 132/88  Pulse: 72  Temp: 98.1 F (36.7 C)  Resp: 16   Filed Vitals:   12/25/13 1447  Height: 5\' 9"  (1.753 m)  Weight: 215 lb 12.8 oz (97.886 kg)   Body mass index is 31.85 kg/(m^2). Ideal Body Weight: Weight in (lb) to have BMI = 25: 168.9  GEN: WDWN, NAD, Non-toxic, A & O x 3 HEENT: Atraumatic, Normocephalic. Neck supple. No masses, No LAD. Ears and Nose: No external deformity. CV: RRR, No M/G/R. No JVD. No thrill. No extra heart sounds. PULM: CTA B, no wheezes, crackles, rhonchi. No retractions. No resp. distress. No accessory muscle use. ABD: S, NT, ND, +BS. No rebound. No HSM. EXTR: No c/c/e NEURO Normal gait.  PSYCH: Normally interactive. Conversant. Not depressed or anxious appearing.  Calm demeanor.    Assessment and Plan: Wellness exam Labs pending Stop smoking Counseled   Signed,  Phillips OdorJeffery Alainah Phang, MD

## 2013-12-25 NOTE — Patient Instructions (Signed)
Smoking Cessation Quitting smoking is important to your health and has many advantages. However, it is not always easy to quit since nicotine is a very addictive drug. Oftentimes, people try 3 times or more before being able to quit. This document explains the best ways for you to prepare to quit smoking. Quitting takes hard work and a lot of effort, but you can do it. ADVANTAGES OF QUITTING SMOKING  You will live longer, feel better, and live better.  Your body will feel the impact of quitting smoking almost immediately.  Within 20 minutes, blood pressure decreases. Your pulse returns to its normal level.  After 8 hours, carbon monoxide levels in the blood return to normal. Your oxygen level increases.  After 24 hours, the chance of having a heart attack starts to decrease. Your breath, hair, and body stop smelling like smoke.  After 48 hours, damaged nerve endings begin to recover. Your sense of taste and smell improve.  After 72 hours, the body is virtually free of nicotine. Your bronchial tubes relax and breathing becomes easier.  After 2 to 12 weeks, lungs can hold more air. Exercise becomes easier and circulation improves.  The risk of having a heart attack, stroke, cancer, or lung disease is greatly reduced.  After 1 year, the risk of coronary heart disease is cut in half.  After 5 years, the risk of stroke falls to the same as a nonsmoker.  After 10 years, the risk of lung cancer is cut in half and the risk of other cancers decreases significantly.  After 15 years, the risk of coronary heart disease drops, usually to the level of a nonsmoker.  If you are pregnant, quitting smoking will improve your chances of having a healthy baby.  The people you live with, especially any children, will be healthier.  You will have extra money to spend on things other than cigarettes. QUESTIONS TO THINK ABOUT BEFORE ATTEMPTING TO QUIT You may want to talk about your answers with your  health care provider.  Why do you want to quit?  If you tried to quit in the past, what helped and what did not?  What will be the most difficult situations for you after you quit? How will you plan to handle them?  Who can help you through the tough times? Your family? Friends? A health care provider?  What pleasures do you get from smoking? What ways can you still get pleasure if you quit? Here are some questions to ask your health care provider:  How can you help me to be successful at quitting?  What medicine do you think would be best for me and how should I take it?  What should I do if I need more help?  What is smoking withdrawal like? How can I get information on withdrawal? GET READY  Set a quit date.  Change your environment by getting rid of all cigarettes, ashtrays, matches, and lighters in your home, car, or work. Do not let people smoke in your home.  Review your past attempts to quit. Think about what worked and what did not. GET SUPPORT AND ENCOURAGEMENT You have a better chance of being successful if you have help. You can get support in many ways.  Tell your family, friends, and coworkers that you are going to quit and need their support. Ask them not to smoke around you.  Get individual, group, or telephone counseling and support. Programs are available at local hospitals and health centers. Call   your local health department for information about programs in your area.  Spiritual beliefs and practices may help some smokers quit.  Download a "quit meter" on your computer to keep track of quit statistics, such as how long you have gone without smoking, cigarettes not smoked, and money saved.  Get a self-help book about quitting smoking and staying off tobacco. LEARN NEW SKILLS AND BEHAVIORS  Distract yourself from urges to smoke. Talk to someone, go for a walk, or occupy your time with a task.  Change your normal routine. Take a different route to work.  Drink tea instead of coffee. Eat breakfast in a different place.  Reduce your stress. Take a hot bath, exercise, or read a book.  Plan something enjoyable to do every day. Reward yourself for not smoking.  Explore interactive web-based programs that specialize in helping you quit. GET MEDICINE AND USE IT CORRECTLY Medicines can help you stop smoking and decrease the urge to smoke. Combining medicine with the above behavioral methods and support can greatly increase your chances of successfully quitting smoking.  Nicotine replacement therapy helps deliver nicotine to your body without the negative effects and risks of smoking. Nicotine replacement therapy includes nicotine gum, lozenges, inhalers, nasal sprays, and skin patches. Some may be available over-the-counter and others require a prescription.  Antidepressant medicine helps people abstain from smoking, but how this works is unknown. This medicine is available by prescription.  Nicotinic receptor partial agonist medicine simulates the effect of nicotine in your brain. This medicine is available by prescription. Ask your health care provider for advice about which medicines to use and how to use them based on your health history. Your health care provider will tell you what side effects to look out for if you choose to be on a medicine or therapy. Carefully read the information on the package. Do not use any other product containing nicotine while using a nicotine replacement product.  RELAPSE OR DIFFICULT SITUATIONS Most relapses occur within the first 3 months after quitting. Do not be discouraged if you start smoking again. Remember, most people try several times before finally quitting. You may have symptoms of withdrawal because your body is used to nicotine. You may crave cigarettes, be irritable, feel very hungry, cough often, get headaches, or have difficulty concentrating. The withdrawal symptoms are only temporary. They are strongest  when you first quit, but they will go away within 10-14 days. To reduce the chances of relapse, try to:  Avoid drinking alcohol. Drinking lowers your chances of successfully quitting.  Reduce the amount of caffeine you consume. Once you quit smoking, the amount of caffeine in your body increases and can give you symptoms, such as a rapid heartbeat, sweating, and anxiety.  Avoid smokers because they can make you want to smoke.  Do not let weight gain distract you. Many smokers will gain weight when they quit, usually less than 10 pounds. Eat a healthy diet and stay active. You can always lose the weight gained after you quit.  Find ways to improve your mood other than smoking. FOR MORE INFORMATION  www.smokefree.gov  Document Released: 01/03/2001 Document Revised: 05/26/2013 Document Reviewed: 04/20/2011 ExitCare Patient Information 2015 [employer]. This information is not intended to replace advice given to you by your health care provider. Make sure you discuss any questions you have with your health care provider.  

## 2013-12-26 LAB — LIPID PANEL
CHOL/HDL RATIO: 3.5 ratio
Cholesterol: 165 mg/dL (ref 0–200)
HDL: 47 mg/dL (ref >39)
LDL CALC: 100 mg/dL — AB (ref 0–99)
TRIGLYCERIDES: 89 mg/dL (ref <150)
VLDL: 18 mg/dL (ref 0–40)

## 2013-12-26 LAB — COMPREHENSIVE METABOLIC PANEL
ALBUMIN: 4.1 g/dL (ref 3.5–5.2)
ALK PHOS: 89 U/L (ref 39–117)
ALT: 27 U/L (ref 0–53)
AST: 26 U/L (ref 0–37)
BUN: 13 mg/dL (ref 6–23)
CALCIUM: 9.5 mg/dL (ref 8.4–10.5)
CO2: 25 mEq/L (ref 19–32)
Chloride: 103 mEq/L (ref 96–112)
Creat: 1.01 mg/dL (ref 0.50–1.35)
Glucose, Bld: 74 mg/dL (ref 70–99)
POTASSIUM: 4.4 meq/L (ref 3.5–5.3)
SODIUM: 138 meq/L (ref 135–145)
Total Bilirubin: 0.4 mg/dL (ref 0.2–1.2)
Total Protein: 7.1 g/dL (ref 6.0–8.3)

## 2013-12-26 LAB — GC/CHLAMYDIA PROBE AMP
CT Probe RNA: NEGATIVE
GC Probe RNA: NEGATIVE

## 2013-12-26 LAB — RPR

## 2013-12-26 LAB — HEPATITIS C ANTIBODY: HCV Ab: NEGATIVE

## 2013-12-26 LAB — HIV ANTIBODY (ROUTINE TESTING W REFLEX): HIV: NONREACTIVE

## 2014-09-24 ENCOUNTER — Ambulatory Visit (INDEPENDENT_AMBULATORY_CARE_PROVIDER_SITE_OTHER): Payer: Managed Care, Other (non HMO) | Admitting: Family Medicine

## 2014-09-24 VITALS — BP 120/78 | HR 77 | Temp 98.2°F | Resp 18 | Ht 69.0 in | Wt 203.8 lb

## 2014-09-24 DIAGNOSIS — M5442 Lumbago with sciatica, left side: Secondary | ICD-10-CM

## 2014-09-24 MED ORDER — TRAMADOL HCL 50 MG PO TABS: 50.0000 mg | ORAL_TABLET | Freq: Three times a day (TID) | ORAL | Status: DC | PRN

## 2014-09-24 MED ORDER — CYCLOBENZAPRINE HCL 10 MG PO TABS: 10.0000 mg | ORAL_TABLET | Freq: Two times a day (BID) | ORAL | Status: DC | PRN

## 2014-09-24 MED ORDER — PREDNISONE 20 MG PO TABS: ORAL_TABLET | ORAL | Status: DC

## 2014-09-24 NOTE — Progress Notes (Signed)
[facility] [address], [place] [address] [phone]  Date:  09/24/2014   Name:  Henry Douglas   DOB:  03-Sep-1985   MRN:  [id number]  PCP:  No PCP Per Patient    Chief Complaint: Back Pain   History of Present Illness:  Henry Douglas is a 29 y.o. very pleasant male patient who presents with the following:  He is here today with lower back pain.  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  He worked the night before but did not notice any injury He has had a pulled muscle in his chest , but never had back trouble before He notes the pain in the mid low , more on the left Radiation of pain and aching down the left leg.  He has not noted any weakness or numbness in the leg No bowel or bladder dysfunction He tried some ibuprofen which helped a little bit  There are no active problems to display for this patient.   Past Medical History  Diagnosis Date  . Allergy   . Asthma   . Hypertension     History reviewed. No pertinent past surgical history.  Social History  Substance Use Topics  . Smoking status: Current Every Day Smoker -- 1.00 packs/day for 3 years    Types: Cigarettes  . Smokeless tobacco: None  . Alcohol Use: 0.0 oz/week    0 Standard drinks or equivalent per week    Family History  Problem Relation Age of Onset  . Hypertension Mother   . Diabetes Sister     Allergies  Allergen Reactions  . Nystatin Swelling    Throat swelling    Medication list has been reviewed and updated.  Current Outpatient Prescriptions on File Prior to Visit  Medication Sig Dispense Refill  . cyclobenzaprine (FLEXERIL) 5 MG tablet Take 1 tablet (5 mg total) by mouth at bedtime. (Patient not taking: Reported on 12/25/2013) 30 tablet 1  . ipratropium (ATROVENT) 0.03 % nasal spray Place 2 sprays into the nose 2 (two)  times daily. (Patient not taking: Reported on 12/25/2013) 30 mL 1  . ondansetron (ZOFRAN-ODT) 4 MG disintegrating tablet Take 1 tab every 4-6 hours as needed for nausea or vomiting. (Patient not taking: Reported on 12/25/2013) 10 tablet 0  . oseltamivir (TAMIFLU) 75 MG capsule Take 1 capsule (75 mg total) by mouth 2 (two) times daily. (Patient not taking: Reported on 12/25/2013) 10 capsule 0  . Pseudoeph-CPM-DM-APAP (TYLENOL CHILDRENS COLD/COUGH PO) Take by mouth.    . Pseudoeph-Doxylamine-DM-APAP (NYQUIL PO) Take by mouth.     No current facility-administered medications on file prior to visit.    Review of Systems:  As per HPI- otherwise negative.   Physical Examination: Filed Vitals:   09/24/14 1048  BP: 120/78  Pulse: 7  Temp: 98.2 F (36.8 C)  Resp: 18   Filed Vitals:   09/24/14 1048  Height: 5\' 9"  (1.753 m)  Weight: 203 lb 12.8 oz (92.443 kg)   Body mass index is 30.08 kg/(m^2). Ideal Body Weight: Weight in (lb) to have BMI = 25: 168.9  GEN: WDWN, NAD, Non-toxic, A & O x 3, overweight, looks well HEENT: Atraumatic, Normocephalic. Neck supple. No masses, No LAD. Ears and Nose: No external deformity. CV: RRR, No M/G/R. No JVD. No thrill. No  extra heart sounds. PULM: CTA B, no wheezes, crackles, rhonchi. No retractions. No resp. distress. No accessory muscle use. ABD: S, NT, ND EXTR: No c/c/e NEURO Normal gait.  PSYCH: Normally interactive. Conversant. Not depressed or anxious appearing.  Calm demeanor.  Left lower back is tender and has spasm Positive SLR on the left only No saddle anesthesia Normal BLE strength and DTR   Assessment and Plan:  Left-sided low back pain with left-sided sciatica - Plan: traMADol (ULTRAM) 50 MG tablet, cyclobenzaprine (FLEXERIL) 10 MG tablet, predniSONE (DELTASONE) 20 MG tablet  Treat for lumbar strain and back pain as above See patient instructions for more details.   Given note for work  Signed Abbe Amsterdam, MD

## 2014-09-24 NOTE — Patient Instructions (Signed)
You have a back strain and also seem to have a pinched nerve Use the prednisone as directed- remember no ibuprofen or aleve while on this medication.  Tylenol is ok however Use the flexeril as needed for spasm, and the tramadol as needed for pain Both of these can cause sleepiness- more so if used together. Avoid using together unless necessary  Please keep moving to avoid stiffness- gentle movement and walking will help You can also try heat and/or ice Come back if you are not better in the next few days- Sooner if worse.

## 2014-09-28 ENCOUNTER — Telehealth: Payer: Self-pay | Admitting: Family Medicine

## 2014-09-28 NOTE — Telephone Encounter (Signed)
Patient came in office requesting work note for today. His previous note stated he could return today if well enough. His employer stated he needs a note for today and they will send him to facility they contract with for workers comp. Per Tonette Lederer PA-C will extend note. See letters

## 2016-02-07 ENCOUNTER — Ambulatory Visit (INDEPENDENT_AMBULATORY_CARE_PROVIDER_SITE_OTHER): Payer: PRIVATE HEALTH INSURANCE | Admitting: Family Medicine

## 2016-02-07 VITALS — BP 118/74 | HR 85 | Temp 98.3°F | Resp 18 | Ht 69.0 in | Wt 238.0 lb

## 2016-02-07 DIAGNOSIS — J111 Influenza due to unidentified influenza virus with other respiratory manifestations: Secondary | ICD-10-CM

## 2016-02-07 LAB — POCT INFLUENZA A/B
INFLUENZA A, POC: POSITIVE — AB
Influenza B, POC: NEGATIVE

## 2016-02-07 MED ORDER — OSELTAMIVIR PHOSPHATE 75 MG PO CAPS: 75.0000 mg | ORAL_CAPSULE | Freq: Two times a day (BID) | ORAL | 0 refills | Status: DC

## 2016-02-07 MED ORDER — BENZONATATE 100 MG PO CAPS: 100.0000 mg | ORAL_CAPSULE | Freq: Three times a day (TID) | ORAL | 0 refills | Status: DC | PRN

## 2016-02-07 MED ORDER — IPRATROPIUM BROMIDE 0.03 % NA SOLN: 2.0000 | Freq: Two times a day (BID) | NASAL | 0 refills | Status: DC

## 2016-02-07 NOTE — Progress Notes (Signed)
Subjective:    Patient ID: Henry Douglas, male    DOB: September 12, 1985, 31 y.o.   MRN: [id number]  02/07/2016  Fever; Cough; Chills; and Generalized Body Aches   HPI This 31 y.o. male presents for evaluation of fever, cough, body aches.  Onset two days ago; started with sore throat.  Then yesterday, had 102.2 fever.  Today, fever has been 98-100.  +chills/sweats.  +body aches. +headache.  +ear pain L.  Sore throat has improved.  No pain with swallowing.  +rhinorrhea; +coughing a lot.  Some SOB intermittently. +sputum production; color blind.  No wheezing.  +nausea; no vomiting or diarrhea.  No flu vaccine.  History of asthma childhood.  +tobacco abuse; has newborn babygirl.  Three weeks and two days.   Wife had flu vaccine.  Works in Naval architect at [employer].  PCP: Yetta Barre   Review of Systems  Constitutional: Positive for chills, diaphoresis and fatigue. Negative for activity change, appetite change and fever.  HENT: Positive for congestion, ear pain, rhinorrhea and sore throat.   Eyes: Negative for visual disturbance.  Respiratory: Positive for cough and wheezing. Negative for shortness of breath.   Cardiovascular: Negative for chest pain, palpitations and leg swelling.  Gastrointestinal: Negative for diarrhea, nausea and vomiting.  Endocrine: Negative for cold intolerance, heat intolerance, polydipsia, polyphagia and polyuria.  Neurological: Positive for headaches. Negative for dizziness, tremors, seizures, syncope, facial asymmetry, speech difficulty, weakness, light-headedness and numbness.    Past Medical History:  Diagnosis Date  . Allergy   . Asthma   . Hypertension    History reviewed. No pertinent surgical history. Allergies  Allergen Reactions  . Nystatin Swelling    Throat swelling    Social History   Social History  . Marital status: Single    Spouse name: N/A  . Number of children: N/A  . Years of education: N/A   Occupational History  . Not on file.    Social History Main Topics  . Smoking status: Current Every Day Smoker    Packs/day: 1.00    Years: 3.00    Types: Cigarettes  . Smokeless tobacco: Never Used  . Alcohol use 0.0 oz/week  . Drug use: No  . Sexual activity: Yes    Birth control/ protection: Condom   Other Topics Concern  . Not on file   Social History Narrative  . No narrative on file   Family History  Problem Relation Age of Onset  . Hypertension Mother   . Diabetes Sister        Objective:    BP 118/74 (BP Location: Right Arm, Patient Position: Sitting, Cuff Size: Large)   Pulse 85   Temp 98.3 F (36.8 C) (Oral)   Resp 18   Ht 5\' 9"  (1.753 m)   Wt 238 lb (108 kg)   SpO2 98%   BMI 35.15 kg/m  Physical Exam  Constitutional: He is oriented to person, place, and time. He appears well-developed and well-nourished.  Non-toxic appearance. He appears ill. No distress.  HENT:  Head: Normocephalic and atraumatic.  Right Ear: Tympanic membrane and ear canal normal.  Left Ear: Tympanic membrane and ear canal normal.  Nose: Mucosal edema and rhinorrhea present.  Mouth/Throat: Uvula is midline and oropharynx is clear and moist.  Eyes: Conjunctivae and EOM are normal. Pupils are equal, round, and reactive to light.  Neck: Normal range of motion. Neck supple. Carotid bruit is not present. No thyromegaly present.  Cardiovascular: Normal rate, regular rhythm, normal heart  sounds and intact distal pulses.  Exam reveals no gallop and no friction rub.   No murmur heard. Pulmonary/Chest: Effort normal and breath sounds normal. He has no wheezes. He has no rales.  Lymphadenopathy:    He has no cervical adenopathy.  Neurological: He is alert and oriented to person, place, and time. No cranial nerve deficit.  Skin: Skin is warm and dry. No rash noted. He is not diaphoretic.  Psychiatric: He has a normal mood and affect. His behavior is normal.  Nursing note and vitals reviewed.  Results for orders placed or  performed in visit on 02/07/16  POCT Influenza A/B  Result Value Ref Range   Influenza A, POC Positive (A) Negative   Influenza B, POC Negative Negative       Assessment & Plan:   1. Influenza    New. Supportive care with rest, fluids, Tylenol and Ibuprofen alternating. RTC for acute worsening.   Orders Placed This Encounter  Procedures  . POCT Influenza A/B   Meds ordered this encounter  Medications  . acetaminophen (TYLENOL) 500 MG chewable tablet    Sig: Chew 500 mg by mouth every 6 (six) hours as needed for pain.  Marland Kitchen. oseltamivir (TAMIFLU) 75 MG capsule    Sig: Take 1 capsule (75 mg total) by mouth 2 (two) times daily.    Dispense:  10 capsule    Refill:  0  . ipratropium (ATROVENT) 0.03 % nasal spray    Sig: Place 2 sprays into the nose 2 (two) times daily.    Dispense:  30 mL    Refill:  0  . benzonatate (TESSALON) 100 MG capsule    Sig: Take 1-2 capsules (100-200 mg total) by mouth 3 (three) times daily as needed for cough.    Dispense:  45 capsule    Refill:  0    No Follow-up on file.   Michi Herrmann Paulita FujitaMartin Paytience Bures, M.D. [facility]  [place] [address] [place], [place]  [id number] [phone] phone [phone] fax

## 2016-02-07 NOTE — Patient Instructions (Addendum)
     IF you received an x-ray today, you will receive an invoice from [facility]. Please contact  Radiology at [phone] with questions or concerns regarding your invoice.   IF you received labwork today, you will receive an invoice from [facility]. Please contact [facility] at [phone] with questions or concerns regarding your invoice.   Our billing staff will not be able to assist you with questions regarding bills from these companies.  You will be contacted with the lab results as soon as they are available. The fastest way to get your results is to activate your My Chart account. Instructions are located on the last page of this paperwork. If you have not heard from us regarding the results in 2 weeks, please contact this office.      Influenza, Adult Influenza ("the flu") is an infection in the lungs, nose, and throat (respiratory tract). It is caused by a virus. The flu causes many common cold symptoms, as well as a high fever and body aches. It can make you feel very sick. The flu spreads easily from person to person (is contagious). Getting a flu shot (influenza vaccination) every year is the best way to prevent the flu. Follow these instructions at home:  Take over-the-counter and prescription medicines only as told by your doctor.  Use a cool mist humidifier to add moisture (humidity) to the air in your home. This can make it easier to breathe.  Rest as needed.  Drink enough fluid to keep your pee (urine) clear or pale yellow.  Cover your mouth and nose when you cough or sneeze.  Wash your hands with soap and water often, especially after you cough or sneeze. If you cannot use soap and water, use hand sanitizer.  Stay home from work or school as told by your doctor. Unless you are visiting your doctor, try to avoid leaving home until your fever has been gone for 24 hours without the use of medicine.  Keep all follow-up visits as told by your doctor.  This is important. How is this prevented?  Getting a yearly (annual) flu shot is the best way to avoid getting the flu. You may get the flu shot in late summer, fall, or winter. Ask your doctor when you should get your flu shot.  Wash your hands often or use hand sanitizer often.  Avoid contact with people who are sick during cold and flu season.  Eat healthy foods.  Drink plenty of fluids.  Get enough sleep.  Exercise regularly. Contact a doctor if:  You get new symptoms.  You have:  Chest pain.  Watery poop (diarrhea).  A fever.  Your cough gets worse.  You start to have more mucus.  You feel sick to your stomach (nauseous).  You throw up (vomit). Get help right away if:  You start to be short of breath or have trouble breathing.  Your skin or nails turn a bluish color.  You have very bad pain or stiffness in your neck.  You get a sudden headache.  You get sudden pain in your face or ear.  You cannot stop throwing up. This information is not intended to replace advice given to you by your health care provider. Make sure you discuss any questions you have with your health care provider. Document Released: 10/19/2007 Document Revised: 06/17/2015 Document Reviewed: 11/03/2014 Elsevier Interactive Patient Education  2017 Elsevier Inc.  

## 2016-06-29 ENCOUNTER — Encounter: Payer: Self-pay | Admitting: Family Medicine

## 2016-06-29 ENCOUNTER — Ambulatory Visit (INDEPENDENT_AMBULATORY_CARE_PROVIDER_SITE_OTHER): Payer: PRIVATE HEALTH INSURANCE | Admitting: Family Medicine

## 2016-06-29 VITALS — BP 126/80 | HR 75 | Temp 98.8°F | Resp 18 | Ht 69.5 in | Wt 240.8 lb

## 2016-06-29 DIAGNOSIS — H6690 Otitis media, unspecified, unspecified ear: Secondary | ICD-10-CM | POA: Diagnosis not present

## 2016-06-29 MED ORDER — AMOXICILLIN 875 MG PO TABS: 875.0000 mg | ORAL_TABLET | Freq: Two times a day (BID) | ORAL | 0 refills | Status: DC

## 2016-06-29 NOTE — Progress Notes (Signed)
   SUBJECTIVE: Left ear pain:  Henry Douglas is a 31 y.o. male who complains of sore throat and URI symptoms for the past several days.  A day or two after his sore throat started, he began having Left sided ear pain.  Describes as sharp stabbing and pressure in ear.  Some subjective fevers.  Chills at night.  "Fuzzy" sounding ear on Left.    Does also have some mild sinus congestion and cough, but by far main symptoms is Left ear pain.    No N/V.  Not as hungry but maintaining hydration.    ROS as above.    PMH reviewed. Patient is a nonsmoker.   Medications reviewed.  Physical Exam:  BP 126/80   Pulse 75   Temp 98.8 F (37.1 C) (Oral)   Resp 18   Ht 5' 9.5" (1.765 m)   Wt 240 lb 12.8 oz (109.2 kg)   SpO2 99%   BMI 35.05 kg/m  Gen:  Patient sitting on exam table, appears stated age in no acute distress Head: Normocephalic atraumatic Eyes: EOMI, PERRL, sclera and conjunctiva non-erythematous Ears:  Right ear canal clear and TM non-erythematous.  Left ear canal is clear.  TM is erythematous and opaque.  No tenderness when pulling on external ear.  TTP along posterior aspect of ear down into neck.  No redness hear. Nontender mastoid process.   Nose:  Nasal turbinates are somewhat erythematous BL Mouth: Mucosa membranes moist. Tonsils +2, nonenlarged, non-erythematous. Neck: No cervical lymphadenopathy noted Heart:  RRR, no murmurs auscultated. Pulm:  Clear to auscultation bilaterally with good air movement.  No wheezes or rales noted.    Assessment and Plan:  1.  Left ear infection: - treating as such with amoxicillin - likely triggered by recent viral URI - FU if no improvement in next week.  Return sooner if worsening.

## 2016-06-29 NOTE — Patient Instructions (Addendum)
It was good to see you today.  Take the Amoxicillin twice daily for 7 days = 14 pills.   If you're having any worsening symptoms, despite the treatment, let us know.      IF you received an x-ray today, you will receive an invoice from [facility]. Please contact [facility] at [phone] with questions or concerns regarding your invoice.   IF you received labwork today, you will receive an invoice from [facility]. Please contact [facility] at [phone] with questions or concerns regarding your invoice.   Our billing staff will not be able to assist you with questions regarding bills from these companies.  You will be contacted with the lab results as soon as they are available. The fastest way to get your results is to activate your My Chart account. Instructions are located on the last page of this paperwork. If you have not heard from us regarding the results in 2 weeks, please contact this office.

## 2016-09-28 DIAGNOSIS — J3501 Chronic tonsillitis: Secondary | ICD-10-CM | POA: Insufficient documentation

## 2016-09-28 DIAGNOSIS — J324 Chronic pansinusitis: Secondary | ICD-10-CM | POA: Insufficient documentation

## 2016-11-03 ENCOUNTER — Ambulatory Visit (INDEPENDENT_AMBULATORY_CARE_PROVIDER_SITE_OTHER): Payer: PRIVATE HEALTH INSURANCE | Admitting: Physician Assistant

## 2016-11-03 VITALS — BP 140/94 | HR 97 | Temp 98.6°F | Resp 16 | Ht 68.0 in | Wt 236.0 lb

## 2016-11-03 DIAGNOSIS — J029 Acute pharyngitis, unspecified: Secondary | ICD-10-CM | POA: Diagnosis not present

## 2016-11-03 LAB — POCT CBC
Granulocyte percent: 57.5 %G (ref 37–80)
HCT, POC: 40.4 % — AB (ref 43.5–53.7)
Hemoglobin: 13.7 g/dL — AB (ref 14.1–18.1)
Lymph, poc: 3.2 (ref 0.6–3.4)
MCH, POC: 28.2 pg (ref 27–31.2)
MCHC: 34 g/dL (ref 31.8–35.4)
MCV: 82.9 fL (ref 80–97)
MID (cbc): 0.5 (ref 0–0.9)
MPV: 5.6 fL (ref 0–99.8)
POC Granulocyte: 4.9 (ref 2–6.9)
POC LYMPH PERCENT: 36.9 %L (ref 10–50)
POC MID %: 5.6 % (ref 0–12)
Platelet Count, POC: 361 10*3/uL (ref 142–424)
RBC: 4.87 M/uL (ref 4.69–6.13)
RDW, POC: 13.9 %
WBC: 8.6 10*3/uL (ref 4.6–10.2)

## 2016-11-03 LAB — POCT RAPID STREP A (OFFICE): Rapid Strep A Screen: NEGATIVE

## 2016-11-03 MED ORDER — HYDROCODONE-HOMATROPINE 5-1.5 MG/5ML PO SYRP: 5.0000 mL | ORAL_SOLUTION | Freq: Three times a day (TID) | ORAL | 0 refills | Status: DC | PRN

## 2016-11-03 NOTE — Progress Notes (Signed)
Henry Douglas  MRN: [id number] DOB: 07-23-1985  PCP: Patient, No Pcp Per  Subjective:  Patient complains of sore throat. Associated symptoms include enlarged tonsils, myalgias, night sweats, pain while swallowing, sore throat and swollen glands. Onset of symptoms was 2 days ago, and have been gradually worsening since that time. He is drinking moderate amounts of fluids. He has not had recent close exposure to someone with proven streptococcal pharyngitis. He has taken Tylenol for pain - this is helping some.   Review of Systems  Constitutional: Positive for diaphoresis. Negative for chills and fever.  HENT: Positive for sore throat and trouble swallowing. Negative for congestion, postnasal drip, rhinorrhea, sinus pain and sinus pressure.   Respiratory: Negative for cough.     There are no active problems to display for this patient.   Current Outpatient Prescriptions on File Prior to Visit  Medication Sig Dispense Refill  . acetaminophen (TYLENOL) 500 MG chewable tablet Chew 500 mg by mouth every 6 (six) hours as needed for pain.    Marland Kitchen amoxicillin (AMOXIL) 875 MG tablet Take 1 tablet (875 mg total) by mouth 2 (two) times daily. (Patient not taking: Reported on 11/03/2016) 14 tablet 0  . cyclobenzaprine (FLEXERIL) 10 MG tablet Take 1 tablet (10 mg total) by mouth 2 (two) times daily as needed for muscle spasms. (Patient not taking: Reported on 02/07/2016) 30 tablet 0  . ipratropium (ATROVENT) 0.03 % nasal spray Place 2 sprays into the nose 2 (two) times daily. (Patient not taking: Reported on 06/29/2016) 30 mL 0   No current facility-administered medications on file prior to visit.     Allergies  Allergen Reactions  . Nystatin Swelling    Throat swelling     Objective:  BP (!) 160/82   Pulse 97   Temp 98.6 F (37 C) (Oral)   Resp 16   Ht  (1.727 m)   Wt 236 lb (107 kg)   SpO2 99%   BMI 35.88 kg/m   Physical Exam  Constitutional: He is oriented to person, place,  and time and well-developed, well-nourished, and in no distress. No distress.  HENT:  Right Ear: Tympanic membrane normal.  Left Ear: Tympanic membrane normal.  Mouth/Throat: Mucous membranes are normal. Posterior oropharyngeal edema and posterior oropharyngeal erythema present. No oropharyngeal exudate.  Cardiovascular: Normal rate, regular rhythm and normal heart sounds.   Neurological: He is alert and oriented to person, place, and time. GCS score is 15.  Skin: Skin is warm and dry.  Psychiatric: Mood, memory, affect and judgment normal.  Vitals reviewed.  Results for orders placed or performed in visit on 11/03/16  POCT rapid strep A  Result Value Ref Range   Rapid Strep A Screen Negative Negative  POCT CBC  Result Value Ref Range   WBC 8.6 4.6 - 10.2 K/uL   Lymph, poc 3.2 0.6 - 3.4   POC LYMPH PERCENT 36.9 10 - 50 %L   MID (cbc) 0.5 0 - 0.9   POC MID % 5.6 0 - 12 %M   POC Granulocyte 4.9 2 - 6.9   Granulocyte percent 57.5 37 - 80 %G   RBC 4.87 4.69 - 6.13 M/uL   Hemoglobin 13.7 (A) 14.1 - 18.1 g/dL   HCT, POC 13.2 (A) 44.0 - 53.7 %   MCV 82.9 80 - 97 fL   MCH, POC 28.2 27 - 31.2 pg   MCHC 34.0 31.8 - 35.4 g/dL   RDW, POC 10.2 %  Platelet Count, POC 361 142 - 424 K/uL   MPV 5.6 0 - 99.8 fL    Assessment and Plan :  1. Sore throat - Culture, Group A Strep - POCT rapid strep A - POCT CBC - HYDROcodone-homatropine (HYCODAN) 5-1.5 MG/5ML syrup; Take 5 mLs by mouth every 8 (eight) hours as needed for cough.  Dispense: 120 mL; Refill: 0 - Suspect viral pharyngitis. Supportive care encouraged. Strep culture is pending. Will contact with results. He agrees with plan.   Marco Collie, PA-C  [facility] at [facility] 11/03/2016 9:39 AM

## 2016-11-03 NOTE — Patient Instructions (Addendum)
I will contact you with the results of your strep culture.  Take Tylenol and Ibuprofen for throat pain and body aches.  Stay well hydrated - drink 2-3 liters of water daily.  See below for salt water gargles.   Thank you for coming in today. I hope you feel we met your needs. Feel free to call PCP if you have any questions or further requests. Please consider signing up for MyChart if you do not already have it, as this is a great way to communicate with me.  Best,  Whitney McVey, PA-C   Pharyngitis Pharyngitis is redness, pain, and swelling (inflammation) of your pharynx. What are the causes? Pharyngitis is usually caused by infection. Most of the time, these infections are from viruses (viral) and are part of a cold. However, sometimes pharyngitis is caused by bacteria (bacterial). Pharyngitis can also be caused by allergies. Viral pharyngitis may be spread from person to person by coughing, sneezing, and personal items or utensils (cups, forks, spoons, toothbrushes). Bacterial pharyngitis may be spread from person to person by more intimate contact, such as kissing. What are the signs or symptoms? Symptoms of pharyngitis include:  Sore throat.  Tiredness (fatigue).  Low-grade fever.  Headache.  Joint pain and muscle aches.  Skin rashes.  Swollen lymph nodes.  Plaque-like film on throat or tonsils (often seen with bacterial pharyngitis).  How is this diagnosed? Your health care provider will ask you questions about your illness and your symptoms. Your medical history, along with a physical exam, is often all that is needed to diagnose pharyngitis. Sometimes, a rapid strep test is done. Other lab tests may also be done, depending on the suspected cause. How is this treated? Viral pharyngitis will usually get better in 3-4 days without the use of medicine. Bacterial pharyngitis is treated with medicines that kill germs (antibiotics). Follow these instructions at home:  Drink  enough water and fluids to keep your urine clear or pale yellow.  Only take over-the-counter or prescription medicines as directed by your health care provider: ? If you are prescribed antibiotics, make sure you finish them even if you start to feel better. ? Do not take aspirin.  Get lots of rest.  Gargle with 8 oz of salt water ( tsp of salt per 1 qt of water) as often as every 1-2 hours to soothe your throat.  Throat lozenges (if you are not at risk for choking) or sprays may be used to soothe your throat. Contact a health care provider if:  You have large, tender lumps in your neck.  You have a rash.  You cough up green, yellow-brown, or bloody spit. Get help right away if:  Your neck becomes stiff.  You drool or are unable to swallow liquids.  You vomit or are unable to keep medicines or liquids down.  You have severe pain that does not go away with the use of recommended medicines.  You have trouble breathing (not caused by a stuffy nose). This information is not intended to replace advice given to you by your health care provider. Make sure you discuss any questions you have with your health care provider. Document Released: 01/09/2005 Document Revised: 06/17/2015 Document Reviewed: 09/16/2012 Elsevier Interactive Patient Education  2017 [employer].   IF you received an x-ray today, you will receive an invoice from [facility]. Please contact [facility] at [phone] with questions or concerns regarding your invoice.   IF you received labwork today, you will receive an  invoice from [place]. Please contact [facility] at [phone] with questions or concerns regarding your invoice.   Our billing staff will not be able to assist you with questions regarding bills from these companies.  You will be contacted with the lab results as soon as they are available. The fastest way to get your results is to activate your My Chart account. Instructions are  located on the last page of this paperwork. If you have not heard from [place] regarding the results in 2 weeks, please contact this office.

## 2016-11-06 LAB — CULTURE, GROUP A STREP

## 2016-11-06 NOTE — Progress Notes (Signed)
Called and spoke with pt. He is feeling better. Will not treat.

## 2016-11-14 ENCOUNTER — Other Ambulatory Visit: Payer: Self-pay | Admitting: Otolaryngology

## 2017-05-02 ENCOUNTER — Other Ambulatory Visit: Payer: Self-pay

## 2017-05-02 ENCOUNTER — Ambulatory Visit (INDEPENDENT_AMBULATORY_CARE_PROVIDER_SITE_OTHER): Payer: PRIVATE HEALTH INSURANCE

## 2017-05-02 ENCOUNTER — Ambulatory Visit: Payer: PRIVATE HEALTH INSURANCE | Admitting: Emergency Medicine

## 2017-05-02 ENCOUNTER — Encounter: Payer: Self-pay | Admitting: Emergency Medicine

## 2017-05-02 VITALS — BP 128/86 | HR 105 | Temp 99.4°F | Resp 16 | Ht 68.0 in | Wt 244.8 lb

## 2017-05-02 DIAGNOSIS — R0781 Pleurodynia: Secondary | ICD-10-CM

## 2017-05-02 DIAGNOSIS — R509 Fever, unspecified: Secondary | ICD-10-CM | POA: Diagnosis not present

## 2017-05-02 DIAGNOSIS — R091 Pleurisy: Secondary | ICD-10-CM

## 2017-05-02 LAB — POCT CBC
GRANULOCYTE PERCENT: 52.3 % (ref 37–80)
HCT, POC: 44.1 % (ref 43.5–53.7)
Hemoglobin: 13.7 g/dL — AB (ref 14.1–18.1)
Lymph, poc: 3.4 (ref 0.6–3.4)
MCH: 26.7 pg — AB (ref 27–31.2)
MCHC: 31.2 g/dL — AB (ref 31.8–35.4)
MCV: 85.6 fL (ref 80–97)
MID (CBC): 0.6 (ref 0–0.9)
MPV: 5.7 fL (ref 0–99.8)
PLATELET COUNT, POC: 355 10*3/uL (ref 142–424)
POC Granulocyte: 4.4 (ref 2–6.9)
POC LYMPH PERCENT: 40.3 %L (ref 10–50)
POC MID %: 7.4 % (ref 0–12)
RBC: 5.15 M/uL (ref 4.69–6.13)
RDW, POC: 14.9 %
WBC: 8.4 10*3/uL (ref 4.6–10.2)

## 2017-05-02 LAB — POCT URINALYSIS DIP (MANUAL ENTRY)
BILIRUBIN UA: NEGATIVE
GLUCOSE UA: NEGATIVE mg/dL
Ketones, POC UA: NEGATIVE mg/dL
Leukocytes, UA: NEGATIVE
Nitrite, UA: NEGATIVE
PH UA: 5.5 (ref 5.0–8.0)
Protein Ur, POC: NEGATIVE mg/dL
RBC UA: NEGATIVE
UROBILINOGEN UA: 0.2 U/dL

## 2017-05-02 LAB — COMPREHENSIVE METABOLIC PANEL
ALBUMIN: 4.4 g/dL (ref 3.5–5.5)
ALK PHOS: 116 IU/L (ref 39–117)
ALT: 22 IU/L (ref 0–44)
AST: 20 IU/L (ref 0–40)
Albumin/Globulin Ratio: 1.6 (ref 1.2–2.2)
BILIRUBIN TOTAL: 0.3 mg/dL (ref 0.0–1.2)
BUN / CREAT RATIO: 13 (ref 9–20)
BUN: 12 mg/dL (ref 6–20)
CHLORIDE: 100 mmol/L (ref 96–106)
CO2: 25 mmol/L (ref 20–29)
Calcium: 9.3 mg/dL (ref 8.7–10.2)
Creatinine, Ser: 0.93 mg/dL (ref 0.76–1.27)
GFR calc Af Amer: 128 mL/min/{1.73_m2} (ref >59)
GFR calc non Af Amer: 111 mL/min/{1.73_m2} (ref >59)
GLOBULIN, TOTAL: 2.7 g/dL (ref 1.5–4.5)
Glucose: 86 mg/dL (ref 65–99)
POTASSIUM: 4.3 mmol/L (ref 3.5–5.2)
SODIUM: 139 mmol/L (ref 134–144)
Total Protein: 7.1 g/dL (ref 6.0–8.5)

## 2017-05-02 MED ORDER — PREDNISONE 20 MG PO TABS: 40.0000 mg | ORAL_TABLET | Freq: Every day | ORAL | 0 refills | Status: AC

## 2017-05-02 MED ORDER — AMOXICILLIN-POT CLAVULANATE 875-125 MG PO TABS: 1.0000 | ORAL_TABLET | Freq: Two times a day (BID) | ORAL | 0 refills | Status: AC

## 2017-05-02 NOTE — Progress Notes (Signed)
Henry Douglas 32 y.o.   Chief Complaint  Patient presents with  . Back Pain    and right side of chest x 2-3 days, chills    HISTORY OF PRESENT ILLNESS: This is a 32 y.o. male complaining of fever, chills, and right-sided pleuritic chest pain.  Sharp pain worse on inspiration.  Pain started in the right sided mid back and radiated into the front under his breast area.  No other significant symptoms.  HPI   Prior to Admission medications   Not on File    Allergies  Allergen Reactions  . Nystatin Swelling    Throat swelling    There are no active problems to display for this patient.   Past Medical History:  Diagnosis Date  . Allergy   . Asthma   . Hypertension     No past surgical history on file.  Social History   Socioeconomic History  . Marital status: Single    Spouse name: Not on file  . Number of children: Not on file  . Years of education: Not on file  . Highest education level: Not on file  Occupational History  . Not on file  Social Needs  . Financial resource strain: Not on file  . Food insecurity:    Worry: Not on file    Inability: Not on file  . Transportation needs:    Medical: Not on file    Non-medical: Not on file  Tobacco Use  . Smoking status: Current Every Day Smoker    Packs/day: 1.00    Years: 3.00    Pack years: 3.00    Types: Cigarettes  . Smokeless tobacco: Never Used  Substance and Sexual Activity  . Alcohol use: Yes    Alcohol/week: 0.0 oz  . Drug use: No  . Sexual activity: Yes    Birth control/protection: Condom  Lifestyle  . Physical activity:    Days per week: Not on file    Minutes per session: Not on file  . Stress: Not on file  Relationships  . Social connections:    Talks on phone: Not on file    Gets together: Not on file    Attends religious service: Not on file    Active member of club or organization: Not on file    Attends meetings of clubs or organizations: Not on file    Relationship status:  Not on file  . Intimate partner violence:    Fear of current or ex partner: Not on file    Emotionally abused: Not on file    Physically abused: Not on file    Forced sexual activity: Not on file  Other Topics Concern  . Not on file  Social History Narrative  . Not on file    Family History  Problem Relation Age of Onset  . Hypertension Mother   . Diabetes Sister      Review of Systems  Constitutional: Positive for chills and fever.  HENT: Negative.  Negative for congestion, ear pain and sore throat.   Eyes: Negative.  Negative for discharge and redness.  Respiratory: Negative.  Negative for cough, hemoptysis, shortness of breath and wheezing.   Cardiovascular: Positive for chest pain (Right-sided pleuritic).  Gastrointestinal: Negative.  Negative for abdominal pain, blood in stool, diarrhea, nausea and vomiting.  Genitourinary: Negative.  Negative for dysuria, flank pain and hematuria.  Musculoskeletal: Negative.  Negative for back pain, myalgias and neck pain.  Skin: Negative for rash.  Neurological: Negative.  Negative for dizziness and headaches.  Endo/Heme/Allergies: Negative.   All other systems reviewed and are negative.   Vitals:   05/02/17 1029  BP: 128/86  Pulse: (!) 105  Resp: 16  Temp: 99.4 F (37.4 C)  SpO2: 97%    Physical Exam  Constitutional: He is oriented to person, place, and time. He appears well-developed and well-nourished.  HENT:  Head: Normocephalic and atraumatic.  Nose: Nose normal.  Mouth/Throat: Oropharynx is clear and moist.  Eyes: Pupils are equal, round, and reactive to light. Conjunctivae and EOM are normal.  Neck: Normal range of motion. Neck supple. No JVD present.  Cardiovascular: Normal rate, regular rhythm and normal heart sounds.  Pulmonary/Chest: Effort normal and breath sounds normal.  Abdominal: Soft. Bowel sounds are normal. He exhibits no distension. There is no tenderness.  Musculoskeletal: Normal range of motion. He  exhibits no edema.  Lymphadenopathy:    He has no cervical adenopathy.  Neurological: He is alert and oriented to person, place, and time. No sensory deficit. He exhibits normal muscle tone.  Skin: Skin is warm and dry. Capillary refill takes less than 2 seconds. No rash noted.  Psychiatric: He has a normal mood and affect. His behavior is normal.  Vitals reviewed.  Results for orders placed or performed in visit on 05/02/17 (from the past 24 hour(s))  POCT urinalysis dipstick     Status: Abnormal   Collection Time: 05/02/17 11:09 AM  Result Value Ref Range   Color, UA yellow yellow   Clarity, UA clear clear   Glucose, UA negative negative mg/dL   Bilirubin, UA negative negative   Ketones, POC UA negative negative mg/dL   Spec Grav, UA >=1.610 (A) 1.010 - 1.025   Blood, UA negative negative   pH, UA 5.5 5.0 - 8.0   Protein Ur, POC negative negative mg/dL   Urobilinogen, UA 0.2 0.2 or 1.0 E.U./dL   Nitrite, UA Negative Negative   Leukocytes, UA Negative Negative  POCT CBC     Status: Abnormal   Collection Time: 05/02/17 11:10 AM  Result Value Ref Range   WBC 8.4 4.6 - 10.2 K/uL   Lymph, poc 3.4 0.6 - 3.4   POC LYMPH PERCENT 40.3 10 - 50 %L   MID (cbc) 0.6 0 - 0.9   POC MID % 7.4 0 - 12 %M   POC Granulocyte 4.4 2 - 6.9   Granulocyte percent 52.3 37 - 80 %G   RBC 5.15 4.69 - 6.13 M/uL   Hemoglobin 13.7 (A) 14.1 - 18.1 g/dL   HCT, POC 96.0 45.4 - 53.7 %   MCV 85.6 80 - 97 fL   MCH, POC 26.7 (A) 27 - 31.2 pg   MCHC 31.2 (A) 31.8 - 35.4 g/dL   RDW, POC 09.8 %   Platelet Count, POC 355 142 - 424 K/uL   MPV 5.7 0 - 99.8 fL   Dg Chest 2 View  Result Date: 05/02/2017 CLINICAL DATA:  Fever, pleuritic chest pain, history of asthma, current smoker. EXAM: CHEST - 2 VIEW COMPARISON:  Chest x-ray of November 11, 2012 FINDINGS: The lungs are adequately inflated. There is no focal infiltrate. The interstitial markings are slightly more conspicuous today than on the previous study. The  heart and pulmonary vascularity are normal. The mediastinum is normal in width. The trachea is midline. The bony thorax exhibits no acute abnormality. IMPRESSION: Slight interval increase in the prominence of the pulmonary interstitium may reflect exacerbation of acute asthma. There is  no alveolar pneumonia. Electronically Signed   By: David  Swazil[place]Jordan M.D.   On: 05/02/2017 11:08     ASSESSMENT & PLAN: Anne was seen today for back pain.  Diagnoses and all orders for this visit:  Fever, unspecified fever cause -     POCT CBC -     POCT urinalysis dipstick -     Comprehensive metabolic panel -     DG Chest 2 View; Future  Pleuritic chest pain -     POCT CBC -     POCT urinalysis dipstick -     Comprehensive metabolic panel -     DG Chest 2 View; Future  Pleurisy -     predniSONE (DELTASONE) 20 MG tablet; Take 2 tablets (40 mg total) by mouth daily with breakfast for 5 days. -     amoxicillin-clavulanate (AUGMENTIN) 875-125 MG tablet; Take 1 tablet by mouth 2 (two) times daily for 7 days.    Patient Instructions       IF you received an x-ray today, you will receive an invoice from [facility]. Please contact [facility] at [phone] with questions or concerns regarding your invoice.   IF you received labwork today, you will receive an invoice from [facility]. Please contact [facility] at [phone] with questions or concerns regarding your invoice.   Our billing staff will not be able to assist you with questions regarding bills from these companies.  You will be contacted with the lab results as soon as they are available. The fastest way to get your results is to activate your My Chart account. Instructions are located on the last page of this paperwork. If you have not heard from us regarding the results in 2 weeks, please contact this office.     Pleurisy Pleurisy is irritation and swelling (inflammation) of the linings of your lungs (pleura). This  can cause pain in your chest, back, or shoulder. It can also cause trouble breathing. Follow these instructions at home: Medicines  Take over-the-counter and prescription medicines only as told by your doctor.  If you were prescribed antibiotic medicine, take it as told by your doctor. Do not stop taking the antibiotic even if you start to feel better. Activity  Rest and return to your normal activities as told by your doctor. Ask your doctor what activities are safe for you.  Do not drive or use heavy machinery while taking prescription pain medicine. General instructions  Watch for any changes in your condition.  Take deep breaths often, even if it is painful. This can help prevent lung problems.  When lying down, lie on your painful side. This may help you feel less pain.  Do not smoke. If you need help quitting, ask your doctor.  Keep all follow-up visits as told by your doctor. This is important. Contact a doctor if:  You have pain that: ? Gets worse. ? Does not get better with medicine. ? Lasts for more than 1 week.  You have a fever or chills.  You have a cough that does not get better at home.  You have trouble breathing that does not get better at home.  You cough up liquid that looks like pus (purulent secretions). Get help right away if:  Your lips, fingernails, or toenails turn dark or turn blue.  You cough up blood.  You have trouble breathing that gets worse.  You are making loud noises when you breathe (wheezing) and this gets worse.  You have pain that  spreads to your neck, arms, or jaw.  You get a rash.  You throw up (vomit).  You pass out (faint). Summary  Pleurisy is irritation and swelling (inflammation) of the linings of your lungs (pleura).  Pleurisy can cause pain and trouble breathing.  If you have a cough that does not get better at home, contact your doctor.  Get help right away if you are having trouble breathing and it is  getting worse. This information is not intended to replace advice given to you by your health care provider. Make sure you discuss any questions you have with your health care provider. Document Released: 12/23/2007 Document Revised: 10/04/2015 Document Reviewed: 10/04/2015 Elsevier Interactive Patient Education  2017 Elsevier Inc.      Edwina Barth, MD [facility]

## 2017-05-02 NOTE — Patient Instructions (Addendum)
     IF you received an x-ray today, you will receive an invoice from [facility]. Please contact [facility] at [phone] with questions or concerns regarding your invoice.   IF you received labwork today, you will receive an invoice from [facility]. Please contact [facility] at [phone] with questions or concerns regarding your invoice.   Our billing staff will not be able to assist you with questions regarding bills from these companies.  You will be contacted with the lab results as soon as they are available. The fastest way to get your results is to activate your My Chart account. Instructions are located on the last page of this paperwork. If you have not heard from us regarding the results in 2 weeks, please contact this office.      Pleurisy Pleurisy is irritation and swelling (inflammation) of the linings of your lungs (pleura). This can cause pain in your chest, back, or shoulder. It can also cause trouble breathing. Follow these instructions at home: Medicines  Take over-the-counter and prescription medicines only as told by your doctor.  If you were prescribed antibiotic medicine, take it as told by your doctor. Do not stop taking the antibiotic even if you start to feel better. Activity  Rest and return to your normal activities as told by your doctor. Ask your doctor what activities are safe for you.  Do not drive or use heavy machinery while taking prescription pain medicine. General instructions  Watch for any changes in your condition.  Take deep breaths often, even if it is painful. This can help prevent lung problems.  When lying down, lie on your painful side. This may help you feel less pain.  Do not smoke. If you need help quitting, ask your doctor.  Keep all follow-up visits as told by your doctor. This is important. Contact a doctor if:  You have pain that:  Gets worse.  Does not get better with medicine.  Lasts for more than  1 week.  You have a fever or chills.  You have a cough that does not get better at home.  You have trouble breathing that does not get better at home.  You cough up liquid that looks like pus (purulent secretions). Get help right away if:  Your lips, fingernails, or toenails turn dark or turn blue.  You cough up blood.  You have trouble breathing that gets worse.  You are making loud noises when you breathe (wheezing) and this gets worse.  You have pain that spreads to your neck, arms, or jaw.  You get a rash.  You throw up (vomit).  You pass out (faint). Summary  Pleurisy is irritation and swelling (inflammation) of the linings of your lungs (pleura).  Pleurisy can cause pain and trouble breathing.  If you have a cough that does not get better at home, contact your doctor.  Get help right away if you are having trouble breathing and it is getting worse. This information is not intended to replace advice given to you by your health care provider. Make sure you discuss any questions you have with your health care provider. Document Released: 12/23/2007 Document Revised: 10/04/2015 Document Reviewed: 10/04/2015 Elsevier Interactive Patient Education  2017 Elsevier Inc.  

## 2017-06-08 ENCOUNTER — Encounter: Payer: Self-pay | Admitting: Family Medicine

## 2017-06-13 ENCOUNTER — Encounter: Payer: Self-pay | Admitting: Family Medicine

## 2018-08-07 ENCOUNTER — Other Ambulatory Visit: Payer: Self-pay | Admitting: Internal Medicine

## 2018-08-07 ENCOUNTER — Ambulatory Visit: Payer: Self-pay | Admitting: *Deleted

## 2018-08-07 DIAGNOSIS — Z20822 Contact with and (suspected) exposure to covid-19: Secondary | ICD-10-CM

## 2018-08-07 DIAGNOSIS — Z20828 Contact with and (suspected) exposure to other viral communicable diseases: Secondary | ICD-10-CM

## 2018-08-07 NOTE — Telephone Encounter (Signed)
I was exposed to a co-worker who is positive for the COVID-19.   Do I need to be tested? I let him know starting today that he does not need an appt to be tested.   I let him know about the [facility] location in [place].   I made him aware that he needs to stay in the car and wear a mask.  Goes to [facility].  Order entered.

## 2018-08-12 LAB — NOVEL CORONAVIRUS, NAA: SARS-CoV-2, NAA: NOT DETECTED

## 2018-12-24 ENCOUNTER — Other Ambulatory Visit: Payer: Self-pay

## 2018-12-24 DIAGNOSIS — Z20822 Contact with and (suspected) exposure to covid-19: Secondary | ICD-10-CM

## 2018-12-26 LAB — NOVEL CORONAVIRUS, NAA: SARS-CoV-2, NAA: NOT DETECTED

## 2019-08-10 ENCOUNTER — Ambulatory Visit (HOSPITAL_COMMUNITY)
Admission: EM | Admit: 2019-08-10 | Discharge: 2019-08-10 | Disposition: A | Discharge status: Home / Self Care | Payer: PRIVATE HEALTH INSURANCE | Attending: Family Medicine | Admitting: Family Medicine

## 2019-08-10 ENCOUNTER — Other Ambulatory Visit: Payer: Self-pay

## 2019-08-10 ENCOUNTER — Encounter (HOSPITAL_COMMUNITY): Payer: Self-pay

## 2019-08-10 DIAGNOSIS — R091 Pleurisy: Secondary | ICD-10-CM

## 2019-08-10 HISTORY — DX: Anxiety disorder, unspecified: F41.9

## 2019-08-10 MED ORDER — IBUPROFEN 800 MG PO TABS
800.0000 mg | ORAL_TABLET | Freq: Three times a day (TID) | ORAL | 0 refills | Status: DC
Start: 2019-08-10 — End: 2022-02-10

## 2019-08-10 MED ORDER — PREDNISONE 20 MG PO TABS: 20.0000 mg | ORAL_TABLET | Freq: Two times a day (BID) | ORAL | 0 refills | Status: DC

## 2019-08-10 NOTE — Discharge Instructions (Signed)
Take prednisone 2 times a day.  Take 2 doses today. May take ibuprofen 3 times a day with food for pain and inflammation. Activity as tolerated, avoid straining ribs and chest wall Call your family doctor if not improving in a few days

## 2019-08-10 NOTE — ED Triage Notes (Signed)
Pt c/o acute CP to left chest area under left breast, radiating through to his left back. Pt states pain is "sharp" on 7/f10 scale and first occurred during the middle of the night, two nights ago and woke him up.  Also states mild SOB and that CP increases with inspiration/movement.  Denies dizziness, n/v, diaphoresis. Bilat lung sounds CTA. EKG performed and given to Dr. Delton See who advised pt can return to the waiting room and be further evaluated according to time of arrival.

## 2019-08-10 NOTE — ED Provider Notes (Signed)
[facility]    CSN: [id number] Arrival date & time: 08/10/19  1618      History   Chief Complaint Chief Complaint  Patient presents with   Chest Pain    HPI Henry Douglas is a 34 y.o. male.   HPI   Patient is here for chest pain.  He states is a stabbing pain under his left pec area radiates straight through to his back.  Is worse with deep breath.  No pain with movement.  No pain with exertion.  No shortness of breath.  No cough or chest congestion.  No sputum production.  No fever or chills.  No nausea or vomiting.  No GI distress or abdominal pain.  No change in appetite. Patient is obese.  He does not have any other cardiac risk factors such as hypertension, heart disease, hyperlipidemia, or diabetes.  No family history.  He is a smoker He has been seen for chest pain previously.  It was found to be pleurisy   Past Medical History:  Diagnosis Date   Allergy    Anxiety    Asthma    Hypertension     Patient Active Problem List   Diagnosis Date Noted   Fever 05/02/2017   Pleuritic chest pain 05/02/2017   Pleurisy 05/02/2017    History reviewed. No pertinent surgical history.     Home Medications    Prior to Admission medications   Medication Sig Start Date End Date Taking? Authorizing Provider  divalproex (DEPAKOTE ER) 250 MG 24 hr tablet Take by mouth. 08/08/19  Yes [provider]  hydrOXYzine (ATARAX/VISTARIL) 25 MG tablet Take 25 mg by mouth 3 (three) times daily. 08/06/19  Yes [provider]  Choline Fenofibrate (FENOFIBRIC ACID) 135 MG CPDR Take 1 capsule by mouth daily. 08/05/19   [provider]  fluconazole (DIFLUCAN) 100 MG tablet Take 100 mg by mouth daily. 08/06/19   [provider]  ibuprofen (ADVIL) 800 MG tablet Take 1 tablet (800 mg total) by mouth 3 (three) times daily. 08/10/19   Eustace Moore, MD  ketoconazole (NIZORAL) 2 % cream SMARTSIG:1 Application Topical 1 to 2 Times Daily  07/06/19   [provider]  predniSONE (DELTASONE) 20 MG tablet Take 1 tablet (20 mg total) by mouth 2 (two) times daily with a meal. 08/10/19   Eustace Moore, MD  Vitamin D, Ergocalciferol, (DRISDOL) 1.25 MG (50000 UNIT) CAPS capsule Take 50,000 Units by mouth 2 (two) times a week. 07/30/19   [provider]    Family History Family History  Problem Relation Age of Onset   Hypertension Mother    Diabetes Sister     Social History Social History   Tobacco Use   Smoking status: Current Every Day Smoker    Packs/day: 1.00    Years: 3.00    Pack years: 3.00    Types: Cigarettes   Smokeless tobacco: Never Used  Substance Use Topics   Alcohol use: Yes    Alcohol/week: 0.0 standard drinks   Drug use: No     Allergies   Nystatin   Review of Systems Review of Systems See HPI  Physical Exam Triage Vital Signs ED Triage Vitals  Enc Vitals Group     BP 08/10/19 1634 130/80     Pulse Rate 08/10/19 1634 71     Resp 08/10/19 1634 18     Temp 08/10/19 1634 98.1 F (36.7 C)     Temp Source 08/10/19 1634  Oral     SpO2 08/10/19 1634 100 %     Weight --      Height --      Head Circumference --      Peak Flow --      Pain Score 08/10/19 1646 7     Pain Loc --      Pain Edu? --      Excl. in GC? --    No data found.  Updated Vital Signs BP 130/80 (BP Location: Right Arm)    Pulse 71    Temp 98.1 F (36.7 C) (Oral)    Resp 18    SpO2 100%      Physical Exam Constitutional:      General: He is not in acute distress.    Appearance: He is well-developed. He is obese.     Comments: No acute distress  HENT:     Head: Normocephalic and atraumatic.     Mouth/Throat:     Comments: Mask is in place Eyes:     Conjunctiva/sclera: Conjunctivae normal.     Pupils: Pupils are equal, round, and reactive to light.  Cardiovascular:     Rate and Rhythm: Normal rate and regular rhythm.     Heart sounds: Normal heart sounds.  Pulmonary:     Effort:  Pulmonary effort is normal. No respiratory distress.     Breath sounds: Normal breath sounds.  Chest:     Chest wall: No tenderness.  Abdominal:     General: There is no distension.     Palpations: Abdomen is soft.     Tenderness: There is no abdominal tenderness. There is no right CVA tenderness or left CVA tenderness.  Musculoskeletal:        General: Normal range of motion.     Cervical back: Normal range of motion and neck supple.  Skin:    General: Skin is warm and dry.  Neurological:     General: No focal deficit present.     Mental Status: He is alert.  Psychiatric:        Mood and Affect: Mood normal.        Behavior: Behavior normal.    No chest wall tenderness No epigastric tenderness  UC Treatments / Results  Labs (all labs ordered are listed, but only abnormal results are displayed) Labs Reviewed - No data to display  EKG-EKG is normal.  He has a normal sinus rhythm with sinus arrhythmia.  Possible early repolarization.  No ST or T wave changes  Radiology No results found.  Procedures Procedures (including critical care time)  Medications Ordered in UC Medications - No data to display  Initial Impression / Assessment and Plan / UC Course  I have reviewed the triage vital signs and the nursing notes.  Pertinent labs & imaging results that were available during my care of the patient were reviewed by me and considered in my medical decision making (see chart for details).     Reviewed differential diagnosis of chest pain with patient.  No evidence of cardiac disease.  Lungs are clear.  The chest pain with inspiration does suggest pleurisy.  No epigastric tenderness. Will treat with prednisone.  Ibuprofen. See PCP if not improving Go to ER if worse Final Clinical Impressions(s) / UC Diagnoses   Final diagnoses:  Pleuritic chest pain     Discharge Instructions     Take prednisone 2 times a day.  Take 2 doses today. May take ibuprofen 3 times  a day  with food for pain and inflammation. Activity as tolerated, avoid straining ribs and chest wall Call your family doctor if not improving in a few days    ED Prescriptions    Medication Sig Dispense Auth. Provider   predniSONE (DELTASONE) 20 MG tablet Take 1 tablet (20 mg total) by mouth 2 (two) times daily with a meal. 10 tablet Eustace Moore, MD   ibuprofen (ADVIL) 800 MG tablet Take 1 tablet (800 mg total) by mouth 3 (three) times daily. 21 tablet Eustace Moore, MD     PDMP not reviewed this encounter.   Eustace Moore, MD 08/10/19 [phone]

## 2020-11-16 ENCOUNTER — Emergency Department (HOSPITAL_COMMUNITY): Payer: PRIVATE HEALTH INSURANCE

## 2020-11-16 ENCOUNTER — Other Ambulatory Visit: Payer: Self-pay

## 2020-11-16 ENCOUNTER — Emergency Department (HOSPITAL_COMMUNITY)
Admission: EM | Admit: 2020-11-16 | Discharge: 2020-11-17 | Disposition: A | Discharge status: Home / Self Care | Payer: PRIVATE HEALTH INSURANCE | Attending: Emergency Medicine | Admitting: Emergency Medicine

## 2020-11-16 DIAGNOSIS — J45909 Unspecified asthma, uncomplicated: Secondary | ICD-10-CM | POA: Diagnosis not present

## 2020-11-16 DIAGNOSIS — R0602 Shortness of breath: Secondary | ICD-10-CM | POA: Diagnosis not present

## 2020-11-16 DIAGNOSIS — R059 Cough, unspecified: Secondary | ICD-10-CM | POA: Insufficient documentation

## 2020-11-16 DIAGNOSIS — R0781 Pleurodynia: Secondary | ICD-10-CM | POA: Insufficient documentation

## 2020-11-16 DIAGNOSIS — I10 Essential (primary) hypertension: Secondary | ICD-10-CM | POA: Insufficient documentation

## 2020-11-16 DIAGNOSIS — R079 Chest pain, unspecified: Secondary | ICD-10-CM | POA: Diagnosis present

## 2020-11-16 DIAGNOSIS — F17210 Nicotine dependence, cigarettes, uncomplicated: Secondary | ICD-10-CM | POA: Insufficient documentation

## 2020-11-16 LAB — CBC WITH DIFFERENTIAL/PLATELET
Abs Immature Granulocytes: 0.02 10*3/uL (ref 0.00–0.07)
Basophils Absolute: 0.1 10*3/uL (ref 0.0–0.1)
Basophils Relative: 1 %
Eosinophils Absolute: 0.3 10*3/uL (ref 0.0–0.5)
Eosinophils Relative: 3 %
HCT: 40.4 % (ref 39.0–52.0)
Hemoglobin: 13.5 g/dL (ref 13.0–17.0)
Immature Granulocytes: 0 %
Lymphocytes Relative: 46 %
Lymphs Abs: 3.7 10*3/uL (ref 0.7–4.0)
MCH: 28.6 pg (ref 26.0–34.0)
MCHC: 33.4 g/dL (ref 30.0–36.0)
MCV: 85.6 fL (ref 80.0–100.0)
Monocytes Absolute: 0.5 10*3/uL (ref 0.1–1.0)
Monocytes Relative: 6 %
Neutro Abs: 3.5 10*3/uL (ref 1.7–7.7)
Neutrophils Relative %: 44 %
Platelets: 268 10*3/uL (ref 150–400)
RBC: 4.72 MIL/uL (ref 4.22–5.81)
RDW: 13.8 % (ref 11.5–15.5)
WBC: 8 10*3/uL (ref 4.0–10.5)
nRBC: 0 % (ref 0.0–0.2)

## 2020-11-16 LAB — COMPREHENSIVE METABOLIC PANEL
ALT: 34 U/L (ref 0–44)
AST: 35 U/L (ref 15–41)
Albumin: 3.7 g/dL (ref 3.5–5.0)
Alkaline Phosphatase: 86 U/L (ref 38–126)
Anion gap: 8 (ref 5–15)
BUN: 14 mg/dL (ref 6–20)
CO2: 23 mmol/L (ref 22–32)
Calcium: 9.1 mg/dL (ref 8.9–10.3)
Chloride: 102 mmol/L (ref 98–111)
Creatinine, Ser: 1.03 mg/dL (ref 0.61–1.24)
GFR, Estimated: >60 mL/min (ref >60)
Glucose, Bld: 151 mg/dL — ABNORMAL HIGH (ref 70–99)
Potassium: 4.2 mmol/L (ref 3.5–5.1)
Sodium: 133 mmol/L — ABNORMAL LOW (ref 135–145)
Total Bilirubin: 0.8 mg/dL (ref 0.3–1.2)
Total Protein: 6.5 g/dL (ref 6.5–8.1)

## 2020-11-16 LAB — TROPONIN I (HIGH SENSITIVITY): Troponin I (High Sensitivity): 4 ng/L (ref <18)

## 2020-11-16 NOTE — ED Triage Notes (Signed)
Pt c/o centralized CP x2 days. Worsening with moving torso back. Additional c/o SOB, weakness, cough, back pain, and dizziness. No cardiac history. Denies injury/heavy lifting.

## 2020-11-16 NOTE — ED Provider Notes (Signed)
Emergency Medicine Provider Triage Evaluation Note  Henry Douglas , a 35 y.o. male  was evaluated in triage.  Pt complains of centralized chest pain x2 days that radiates to his back without associated palpitation.  Associated shortness of breath and generalized fatigue, dry cough.  Episodes of lightheadedness.  Review of Systems  Positive: Chest pain, shortness of breath, lightheadedness Negative: Fevers, chills, nausea, vomiting  Physical Exam  BP 128/89 (BP Location: Left Arm)   Pulse 72   Temp 98.1 F (36.7 C)   Resp 18   Ht 5\' 8"  (1.727 m)   Wt 110.7 kg   SpO2 99%   BMI 37.10 kg/m  Gen:   Awake, no distress   Resp:  Normal effort  MSK:   Moves extremities without difficulty  Other:  RRR no m/r/g.  Lung CTA B.  Medical Decision Making  Medically screening exam initiated at 9:02 PM.  Appropriate orders placed.  Henry Douglas was informed that the remainder of the evaluation will be completed by another provider, this initial triage assessment does not replace that evaluation, and the importance of remaining in the ED until their evaluation is complete.  This chart was dictated using voice recognition software, Dragon. Despite the best efforts of this provider to proofread and correct errors, errors may still occur which can change documentation meaning.    Gerre Pebbles 11/16/20 2127    2128, MD 11/17/20 1332

## 2020-11-17 LAB — TROPONIN I (HIGH SENSITIVITY): Troponin I (High Sensitivity): 5 ng/L (ref <18)

## 2020-11-17 MED ORDER — PANTOPRAZOLE SODIUM 20 MG PO TBEC
20.0000 mg | DELAYED_RELEASE_TABLET | Freq: Every day | ORAL | 0 refills | Status: DC
Start: 2020-11-17 — End: 2022-02-10

## 2020-11-17 MED ORDER — PREDNISONE 10 MG PO TABS
40.0000 mg | ORAL_TABLET | Freq: Every day | ORAL | 0 refills | Status: DC
Start: 2020-11-17 — End: 2022-02-10

## 2020-11-17 MED ORDER — KETOROLAC TROMETHAMINE 60 MG/2ML IM SOLN
60.0000 mg | Freq: Once | INTRAMUSCULAR | Status: AC
  Administered 2020-11-17: 60 mg via INTRAMUSCULAR
  Filled 2020-11-17: qty 2

## 2020-11-17 NOTE — ED Provider Notes (Signed)
[facility] EMERGENCY DEPARTMENT Provider Note   CSN: [id number] Arrival date & time: 11/16/20  1912     History Chief Complaint  Patient presents with   Chest Pain    Henry Douglas is a 35 y.o. male.   Chest Pain Henry Douglas is a 35 y.o. male who presents to the Emergency Department complaining of chest pain. He presents the emergency department accompanied by his mother for evaluation of sharp and stabbing chest pain that radiates to his back. Pain is constant in nature and is been present for two days. He has associated mild nonproductive cough and shortness of breath. No reports of fevers, abdominal pain, nausea, vomiting, leg swelling or pain. He has a history of similar episodes in the past that have responded well to prednisone. No known sick contacts. He does not take any routine medications. No history of recent travel, surgeries. No history of DVT/PE.    Past Medical History:  Diagnosis Date   Allergy    Anxiety    Asthma    Hypertension     Patient Active Problem List   Diagnosis Date Noted   Fever 05/02/2017   Pleuritic chest pain 05/02/2017   Pleurisy 05/02/2017    No past surgical history on file.     Family History  Problem Relation Age of Onset   Hypertension Mother    Diabetes Sister     Social History   Tobacco Use   Smoking status: Every Day    Packs/day: 1.00    Years: 3.00    Pack years: 3.00    Types: Cigarettes   Smokeless tobacco: Never  Substance Use Topics   Alcohol use: Yes    Alcohol/week: 0.0 standard drinks   Drug use: No    Home Medications Prior to Admission medications   Medication Sig Start Date End Date Taking? Authorizing Provider  pantoprazole (PROTONIX) 20 MG tablet Take 1 tablet (20 mg total) by mouth daily. 11/17/20  Yes Tilden Fossa, MD  predniSONE (DELTASONE) 10 MG tablet Take 4 tablets (40 mg total) by mouth daily. 11/17/20  Yes Tilden Fossa, MD  Choline Fenofibrate  (FENOFIBRIC ACID) 135 MG CPDR Take 1 capsule by mouth daily. 08/05/19   [provider]  divalproex (DEPAKOTE ER) 250 MG 24 hr tablet Take by mouth. 08/08/19   [provider]  fluconazole (DIFLUCAN) 100 MG tablet Take 100 mg by mouth daily. 08/06/19   [provider]  hydrOXYzine (ATARAX/VISTARIL) 25 MG tablet Take 25 mg by mouth 3 (three) times daily. 08/06/19   [provider]  ibuprofen (ADVIL) 800 MG tablet Take 1 tablet (800 mg total) by mouth 3 (three) times daily. 08/10/19   Eustace Moore, MD  ketoconazole (NIZORAL) 2 % cream SMARTSIG:1 Application Topical 1 to 2 Times Daily 07/06/19   [provider]  Vitamin D, Ergocalciferol, (DRISDOL) 1.25 MG (50000 UNIT) CAPS capsule Take 50,000 Units by mouth 2 (two) times a week. 07/30/19   [provider]    Allergies    Nystatin  Review of Systems   Review of Systems  Cardiovascular:  Positive for chest pain.  All other systems reviewed and are negative.  Physical Exam Updated Vital Signs BP (!) 152/99   Pulse 69   Temp 98.3 F (36.8 C)   Resp (!) 27   Ht 5\' 8"  (1.727 m)   Wt 110.7 kg   SpO2 100%   BMI 37.10 kg/m   Physical Exam Vitals and  nursing note reviewed.  Constitutional:      Appearance: He is well-developed.  HENT:     Head: Normocephalic and atraumatic.  Cardiovascular:     Rate and Rhythm: Normal rate and regular rhythm.     Heart sounds: No murmur heard. Pulmonary:     Effort: Pulmonary effort is normal. No respiratory distress.     Breath sounds: Normal breath sounds.  Abdominal:     Palpations: Abdomen is soft.     Tenderness: There is no abdominal tenderness. There is no guarding or rebound.  Musculoskeletal:        General: No swelling or tenderness.     Comments: 2+ radial and DP pulses bilaterally.  Skin:    General: Skin is warm and dry.  Neurological:     Mental Status: He is alert and oriented to person, place, and time.  Psychiatric:         Behavior: Behavior normal.    ED Results / Procedures / Treatments   Labs (all labs ordered are listed, but only abnormal results are displayed) Labs Reviewed  COMPREHENSIVE METABOLIC PANEL - Abnormal; Notable for the following components:      Result Value   Sodium 133 (*)    Glucose, Bld 151 (*)    All other components within normal limits  CBC WITH DIFFERENTIAL/PLATELET  TROPONIN I (HIGH SENSITIVITY)  TROPONIN I (HIGH SENSITIVITY)    EKG EKG Interpretation  Date/Time:  Tuesday November 16 2020 20:32:26 EDT Ventricular Rate:  75 PR Interval:  154 QRS Duration: 92 QT Interval:  354 QTC Calculation: 395 R Axis:   36 Text Interpretation: Normal sinus rhythm Cannot rule out Anterior infarct , age undetermined Abnormal ECG When compared with ECG of 08/10/2019, No significant change was found Confirmed by Dione Booze ([id number]) on 11/17/2020 3:45:23 AM  Radiology DG Chest 2 View  Result Date: 11/16/2020 CLINICAL DATA:  Chest pain short of breath EXAM: CHEST - 2 VIEW COMPARISON:  05/02/2017 FINDINGS: The heart size and mediastinal contours are within normal limits. Both lungs are clear. The visualized skeletal structures are unremarkable. IMPRESSION: No active cardiopulmonary disease. Electronically Signed   By: Jasmine Pang M.D.   On: 11/16/2020 21:52    Procedures Procedures   Medications Ordered in ED Medications  ketorolac (TORADOL) injection 60 mg (has no administration in time range)    ED Course  I have reviewed the triage vital signs and the nursing notes.  Pertinent labs & imaging results that were available during my care of the patient were reviewed by me and considered in my medical decision making (see chart for details).    MDM Rules/Calculators/A&P                          patient here for evaluation of two days of chest pain. He is non-toxic appearing on evaluation with no respiratory distress. He is perk negative. EKG is without acute ischemic changes.  Troponins are negative times two. Presentation is not consistent with ACS, PE, dissection. He has responded well to present courses in this past for similar symptoms. Will treat with a course of prednisone at this time with outpatient follow-up and return precautions  Final Clinical Impression(s) / ED Diagnoses Final diagnoses:  Pleuritic chest pain    Rx / DC Orders ED Discharge Orders          Ordered    predniSONE (DELTASONE) 10 MG tablet  Daily  11/17/20 0633    pantoprazole (PROTONIX) 20 MG tablet  Daily        11/17/20 8119             Tilden Fossa, MD 11/17/20 [phone]

## 2020-11-17 NOTE — ED Notes (Signed)
Patient verbalizes understanding of d/c instructions. Opportunities for questions and answers were provided. Pt d/c from ED and ambulated to lobby.  

## 2020-11-17 NOTE — Discharge Instructions (Addendum)
You can take ibuprofen, available over the counter according to label instructions as needed for pain.   °

## 2022-02-10 ENCOUNTER — Ambulatory Visit
Admission: EM | Admit: 2022-02-10 | Discharge: 2022-02-10 | Disposition: A | Discharge status: Home / Self Care | Payer: PRIVATE HEALTH INSURANCE | Attending: Nurse Practitioner | Admitting: Nurse Practitioner

## 2022-02-10 DIAGNOSIS — M791 Myalgia, unspecified site: Secondary | ICD-10-CM | POA: Insufficient documentation

## 2022-02-10 DIAGNOSIS — B349 Viral infection, unspecified: Secondary | ICD-10-CM | POA: Diagnosis not present

## 2022-02-10 DIAGNOSIS — Z1152 Encounter for screening for COVID-19: Secondary | ICD-10-CM | POA: Insufficient documentation

## 2022-02-10 LAB — POCT INFLUENZA A/B
Influenza A, POC: NEGATIVE
Influenza B, POC: NEGATIVE

## 2022-02-10 NOTE — Discharge Instructions (Signed)
Your symptoms are likely due to a viral respiratory infection. A respiratory infection is an illness that affects part of the respiratory system, such as the lungs, nose, or throat. Antibiotic medicines are not prescribed for viral infections. This is because antibiotics are designed to kill bacteria. They do not kill viruses.   You may take tylenol or ibuprofen as needed for fevers/headache/body aches. Drink plenty of fluids.  Stay in home isolation until you receive results of your COVID test. You will only be notified for positive results. You may go online to MyChart and review your results.   Go to the ED immediately if you get worse or have any other symptoms.     

## 2022-02-10 NOTE — ED Provider Notes (Signed)
[facility]    CSN: [id number] Arrival date & time: 02/10/22  1051      History   Chief Complaint Chief Complaint  Patient presents with   Headache   Chills   Nasal Congestion    HPI Henry Douglas is a 37 y.o. male.   Subjective:   Henry Douglas is a 37 y.o. male who presents for evaluation of influenza like symptoms. Symptoms include headache, myalgias, nasal congestion, subjective fevers, chills, runny nose, sore throat and diarrhea.  Symptom onset was 1 day ago. He has tried to alleviate the symptoms with rest and nyquil. He denies any known sick contacts. He had the flu back in December.       Past Medical History:  Diagnosis Date   Allergy    Anxiety    Asthma    Hypertension     Patient Active Problem List   Diagnosis Date Noted   Fever 05/02/2017   Pleuritic chest pain 05/02/2017   Pleurisy 05/02/2017   Chronic pansinusitis 09/28/2016   Chronic tonsillitis 09/28/2016    History reviewed. No pertinent surgical history.     Home Medications    Prior to Admission medications   Not on File    Family History Family History  Problem Relation Age of Onset   Hypertension Mother    Diabetes Sister     Social History Social History   Tobacco Use   Smoking status: Every Day    Packs/day: 1.00    Years: 3.00    Total pack years: 3.00    Types: Cigarettes   Smokeless tobacco: Never  Substance Use Topics   Alcohol use: Yes    Alcohol/week: 0.0 standard drinks of alcohol   Drug use: No     Allergies   Nystatin   Review of Systems Review of Systems  Constitutional:  Positive for chills and fever.  HENT:  Positive for congestion, rhinorrhea and sore throat.   Respiratory:  Negative for cough and shortness of breath.   Gastrointestinal:  Positive for diarrhea. Negative for nausea and vomiting.  Musculoskeletal:  Positive for myalgias.  Neurological:  Positive for headaches.  All other systems reviewed and are  negative.    Physical Exam Triage Vital Signs ED Triage Vitals  Enc Vitals Group     BP 02/10/22 1154 (!) 149/83     Pulse Rate 02/10/22 1154 95     Resp 02/10/22 1154 20     Temp 02/10/22 1154 98 F (36.7 C)     Temp Source 02/10/22 1154 Oral     SpO2 02/10/22 1154 95 %     Weight --      Height --      Head Circumference --      Peak Flow --      Pain Score 02/10/22 1155 8     Pain Loc --      Pain Edu? --      Excl. in [place]? --    No data found.  Updated Vital Signs BP (!) 149/83 (BP Location: Left Arm)   Pulse 95   Temp 98 F (36.7 C) (Oral)   Resp 20   SpO2 95%   Visual Acuity Right Eye Distance:   Left Eye Distance:   Bilateral Distance:    Right Eye Near:   Left Eye Near:    Bilateral Near:     Physical Exam   UC Treatments / Results  Labs (all labs ordered are listed,  but only abnormal results are displayed) Labs Reviewed  SARS CORONAVIRUS 2 (TAT 6-24 HRS)  POCT INFLUENZA A/B    EKG   Radiology No results found.  Procedures Procedures (including critical care time)  Medications Ordered in UC Medications - No data to display  Initial Impression / Assessment and Plan / UC Course  I have reviewed the triage vital signs and the nursing notes.  Pertinent labs & imaging results that were available during my care of the patient were reviewed by me and considered in my medical decision making (see chart for details).    37 yo male presenting with acute headache, myalgias, nasal congestion, subjective fevers, chills, runny nose, sore throat and diarrhea.  Patient is afebrile.  Nontoxic.  Physical exam as above.  Flu negative.  COVID pending.  Supportive care advised.  Further recommendations pending results of COVID test.  Today's evaluation has revealed no signs of a dangerous process. Discussed diagnosis with patient and/or guardian. Patient and/or guardian aware of their diagnosis, possible red flag symptoms to watch out for and need for close  follow up. Patient and/or guardian understands verbal and written discharge instructions. Patient and/or guardian comfortable with plan and disposition.  Patient and/or guardian has a clear mental status at this time, good insight into illness (after discussion and teaching) and has clear judgment to make decisions regarding their care  Documentation was completed with the aid of voice recognition software. Transcription may contain typographical errors.  Final Clinical Impressions(s) / UC Diagnoses   Final diagnoses:  Myalgia  Viral illness  Encounter for screening for COVID-19     Discharge Instructions      Your symptoms are likely due to a viral respiratory infection. A respiratory infection is an illness that affects part of the respiratory system, such as the lungs, nose, or throat. Antibiotic medicines are not prescribed for viral infections. This is because antibiotics are designed to kill bacteria. They do not kill viruses. You may take tylenol or ibuprofen as needed for fevers/headache/body aches. Drink plenty of fluids. Stay in home isolation until you receive results of your COVID test. You will only be notified for positive results. You may go online to [place] and review your results. Go to the ED immediately if you get worse or have any other symptoms.      ED Prescriptions   None    PDMP not reviewed this encounter.   Enrique Sack, [place] 02/10/22 1241

## 2022-02-10 NOTE — ED Triage Notes (Signed)
Patient presents to UC for nasal congestion, chills, night sweats, and HA since yesterday. Treating with nyquil.    Denies fever.

## 2022-02-11 LAB — SARS CORONAVIRUS 2 (TAT 6-24 HRS): SARS Coronavirus 2: NEGATIVE

## 2023-05-19 IMAGING — CR DG CHEST 2V
2 series · 2 of 2 positions shown · non-contrast
Comparison: 05/02/2017

CLINICAL DATA: Chest pain short of breath

EXAM:
CHEST - 2 VIEW

[chest pa]
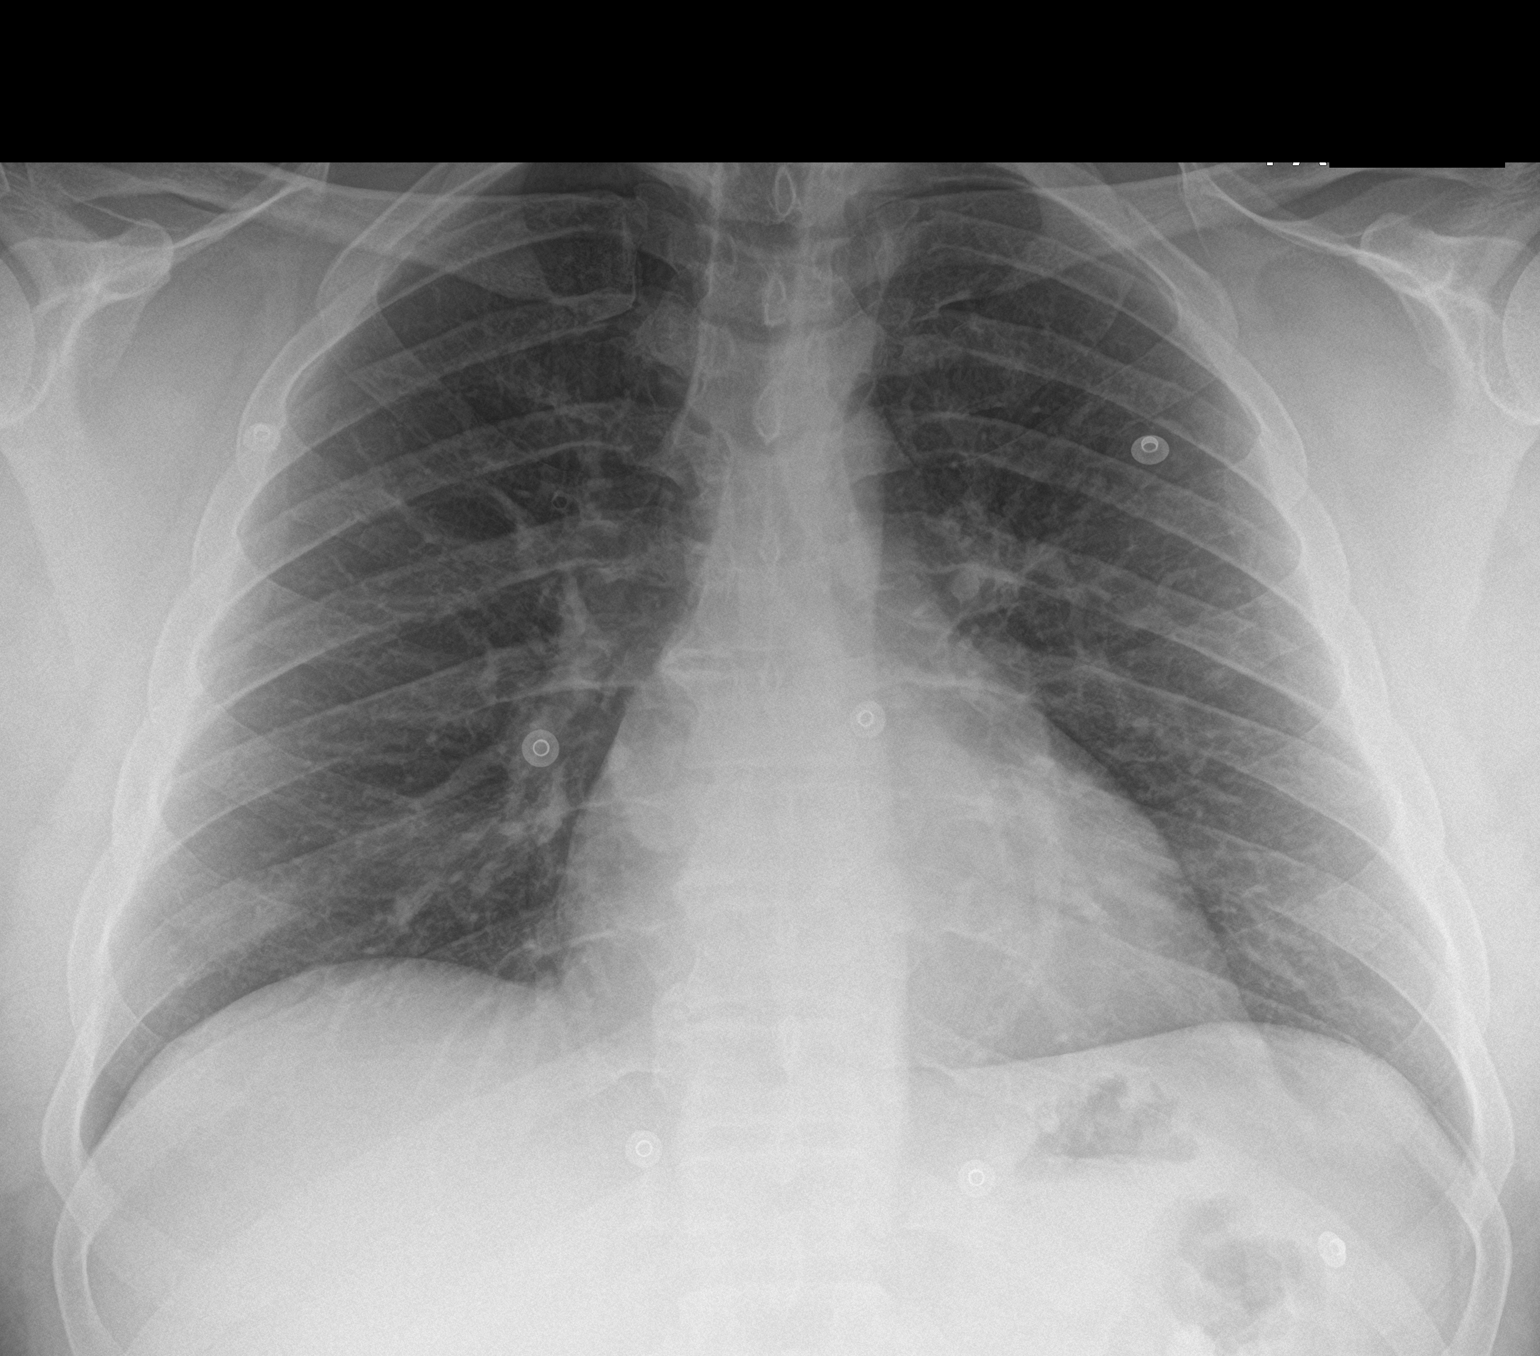

[chest lat]
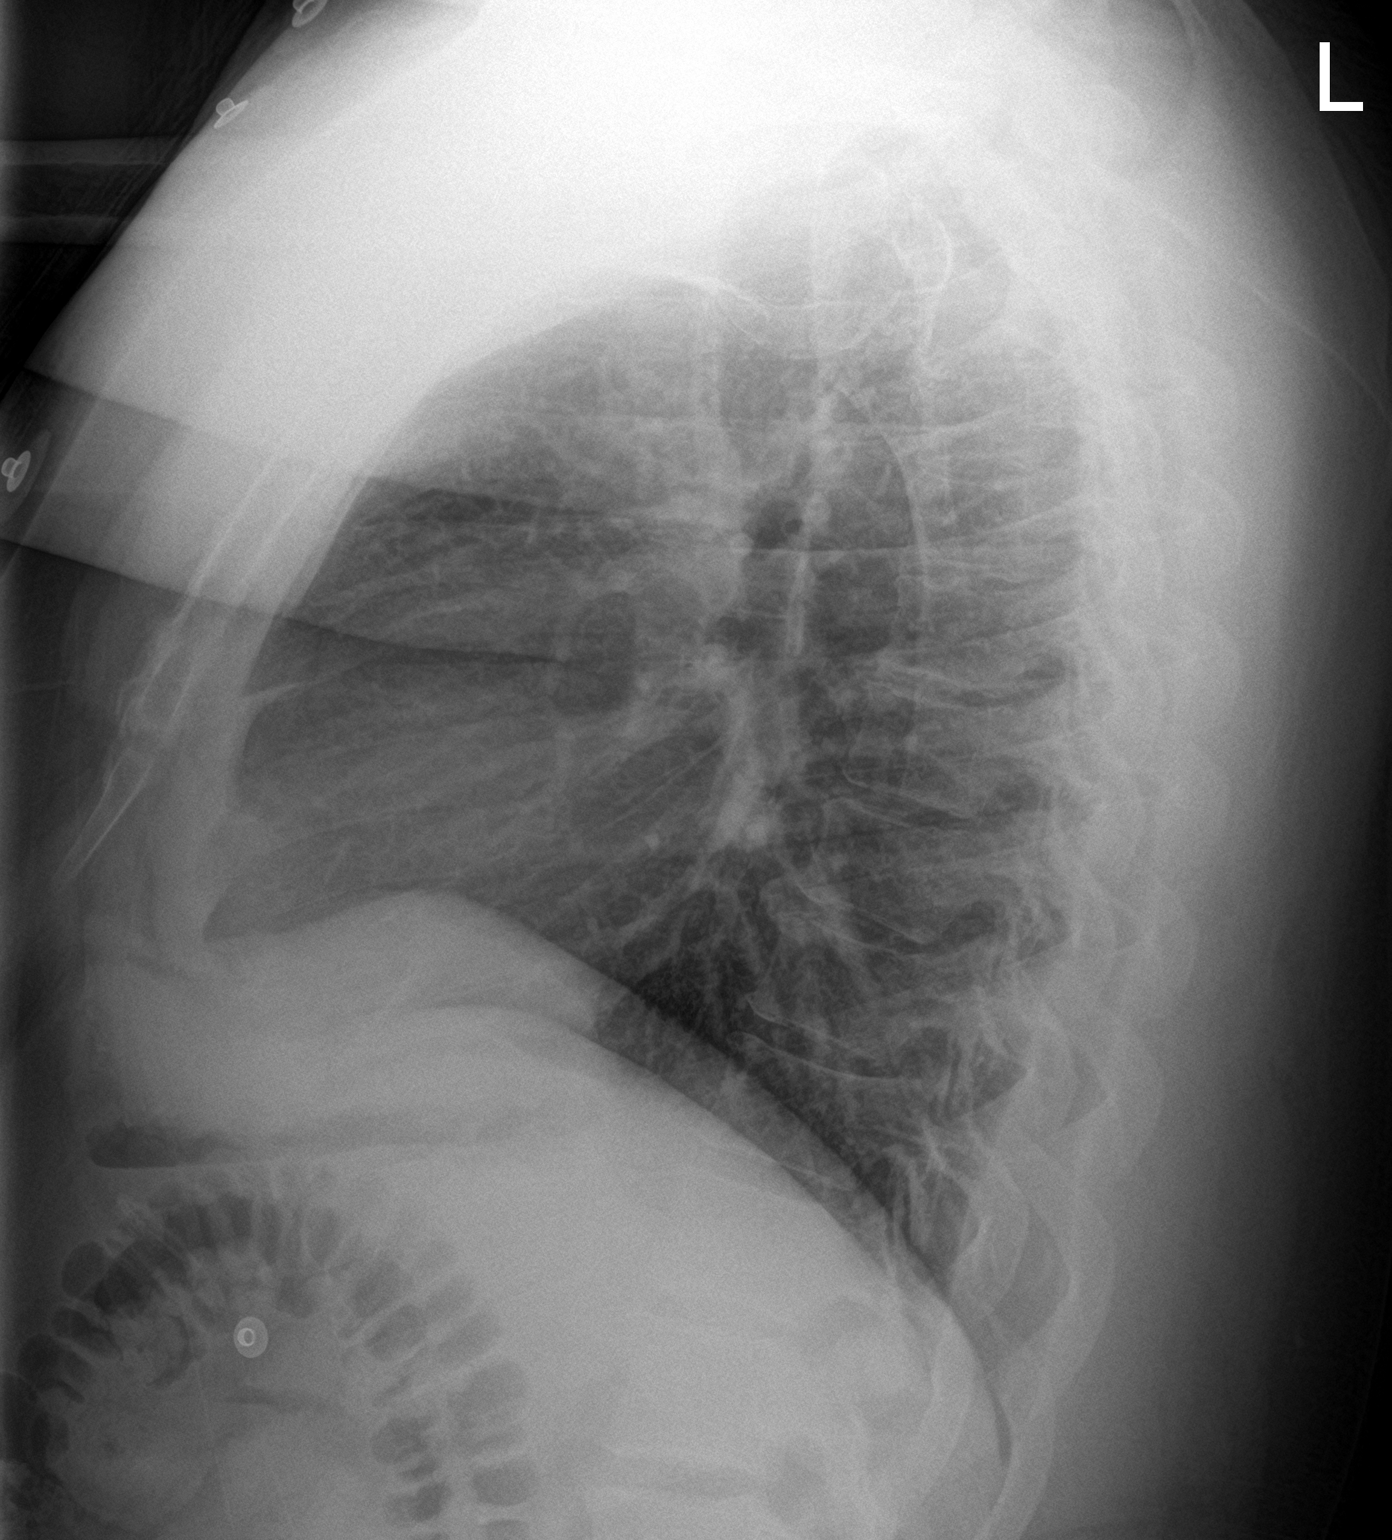

[2 of 2 positions shown; findings below may reference images not displayed]

FINDINGS: The heart size and mediastinal contours are within normal limits.
Both lungs are clear. The visualized skeletal structures are
unremarkable.
IMPRESSION: No active cardiopulmonary disease.
